# Patient Record
Sex: Female | Born: 1945 | Race: Black or African American | Hispanic: No | Smoking: Never smoker
Health system: Southern US, Community
[De-identification: ages and names within clinical notes are randomized; demographics above are authoritative.]

## PROBLEM LIST (undated history)

## (undated) DIAGNOSIS — E1149 Type 2 diabetes mellitus with other diabetic neurological complication: Secondary | ICD-10-CM

## (undated) DIAGNOSIS — E785 Hyperlipidemia, unspecified: Secondary | ICD-10-CM

## (undated) DIAGNOSIS — E1140 Type 2 diabetes mellitus with diabetic neuropathy, unspecified: Secondary | ICD-10-CM

## (undated) DIAGNOSIS — G5602 Carpal tunnel syndrome, left upper limb: Secondary | ICD-10-CM

## (undated) DIAGNOSIS — I10 Essential (primary) hypertension: Secondary | ICD-10-CM

## (undated) DIAGNOSIS — E669 Obesity, unspecified: Secondary | ICD-10-CM

## (undated) DIAGNOSIS — I700 Atherosclerosis of aorta: Secondary | ICD-10-CM

## (undated) DIAGNOSIS — R202 Paresthesia of skin: Secondary | ICD-10-CM

## (undated) DIAGNOSIS — H409 Unspecified glaucoma: Secondary | ICD-10-CM

## (undated) HISTORY — DX: Atherosclerosis of aorta: I70.0

## (undated) HISTORY — DX: Type 2 diabetes mellitus with diabetic neuropathy, unspecified: E11.40

## (undated) HISTORY — DX: Hyperlipidemia, unspecified: E78.5

## (undated) HISTORY — DX: Type 2 diabetes mellitus with other diabetic neurological complication: E11.49

## (undated) HISTORY — DX: Essential (primary) hypertension: I10

## (undated) HISTORY — DX: Obesity, unspecified: E66.9

## (undated) HISTORY — DX: Carpal tunnel syndrome, left upper limb: G56.02

## (undated) HISTORY — DX: Unspecified glaucoma: H40.9

## (undated) HISTORY — DX: Paresthesia of skin: R20.2

---

## 1998-05-02 HISTORY — PX: BREAST EXCISIONAL BIOPSY: SUR124

## 1998-07-15 ENCOUNTER — Ambulatory Visit (HOSPITAL_COMMUNITY): Admission: RE | Admit: 1998-07-15 | Discharge: 1998-07-15 | Payer: Self-pay | Admitting: Obstetrics and Gynecology

## 1998-07-15 ENCOUNTER — Encounter: Payer: Self-pay | Admitting: Obstetrics and Gynecology

## 1999-02-18 ENCOUNTER — Ambulatory Visit (HOSPITAL_COMMUNITY): Admission: RE | Admit: 1999-02-18 | Discharge: 1999-02-18 | Payer: Self-pay | Admitting: Obstetrics and Gynecology

## 1999-02-18 ENCOUNTER — Encounter: Payer: Self-pay | Admitting: Obstetrics and Gynecology

## 1999-03-18 ENCOUNTER — Encounter (HOSPITAL_BASED_OUTPATIENT_CLINIC_OR_DEPARTMENT_OTHER): Payer: Self-pay | Admitting: General Surgery

## 1999-03-18 ENCOUNTER — Encounter (INDEPENDENT_AMBULATORY_CARE_PROVIDER_SITE_OTHER): Payer: Self-pay | Admitting: *Deleted

## 1999-03-18 ENCOUNTER — Ambulatory Visit (HOSPITAL_COMMUNITY): Admission: RE | Admit: 1999-03-18 | Discharge: 1999-03-18 | Payer: Self-pay | Admitting: General Surgery

## 1999-06-21 ENCOUNTER — Encounter: Admission: RE | Admit: 1999-06-21 | Discharge: 1999-06-21 | Payer: Self-pay | Admitting: Family Medicine

## 1999-06-21 ENCOUNTER — Encounter: Payer: Self-pay | Admitting: Family Medicine

## 1999-09-24 ENCOUNTER — Encounter: Admission: RE | Admit: 1999-09-24 | Discharge: 1999-09-24 | Payer: Self-pay | Admitting: General Surgery

## 1999-09-24 ENCOUNTER — Encounter (HOSPITAL_BASED_OUTPATIENT_CLINIC_OR_DEPARTMENT_OTHER): Payer: Self-pay | Admitting: General Surgery

## 2000-09-25 ENCOUNTER — Encounter: Payer: Self-pay | Admitting: Obstetrics and Gynecology

## 2000-09-25 ENCOUNTER — Ambulatory Visit (HOSPITAL_COMMUNITY): Admission: RE | Admit: 2000-09-25 | Discharge: 2000-09-25 | Payer: Self-pay | Admitting: Obstetrics and Gynecology

## 2001-11-26 ENCOUNTER — Ambulatory Visit (HOSPITAL_COMMUNITY): Admission: RE | Admit: 2001-11-26 | Discharge: 2001-11-26 | Payer: Self-pay | Admitting: Gastroenterology

## 2001-11-26 ENCOUNTER — Encounter (INDEPENDENT_AMBULATORY_CARE_PROVIDER_SITE_OTHER): Payer: Self-pay

## 2002-01-04 ENCOUNTER — Emergency Department (HOSPITAL_COMMUNITY): Admission: EM | Admit: 2002-01-04 | Discharge: 2002-01-04 | Payer: Self-pay | Admitting: Emergency Medicine

## 2002-01-04 ENCOUNTER — Encounter: Payer: Self-pay | Admitting: Emergency Medicine

## 2002-01-23 ENCOUNTER — Encounter: Payer: Self-pay | Admitting: Obstetrics and Gynecology

## 2002-01-23 ENCOUNTER — Ambulatory Visit (HOSPITAL_COMMUNITY): Admission: RE | Admit: 2002-01-23 | Discharge: 2002-01-23 | Payer: Self-pay | Admitting: Obstetrics and Gynecology

## 2003-01-15 ENCOUNTER — Encounter: Payer: Self-pay | Admitting: Obstetrics and Gynecology

## 2003-01-15 ENCOUNTER — Encounter: Admission: RE | Admit: 2003-01-15 | Discharge: 2003-01-15 | Payer: Self-pay | Admitting: Obstetrics and Gynecology

## 2004-03-09 ENCOUNTER — Encounter: Admission: RE | Admit: 2004-03-09 | Discharge: 2004-03-09 | Payer: Self-pay | Admitting: Obstetrics and Gynecology

## 2005-03-15 ENCOUNTER — Encounter: Admission: RE | Admit: 2005-03-15 | Discharge: 2005-03-15 | Payer: Self-pay | Admitting: Obstetrics and Gynecology

## 2005-03-23 ENCOUNTER — Other Ambulatory Visit: Admission: RE | Admit: 2005-03-23 | Discharge: 2005-03-23 | Payer: Self-pay | Admitting: Obstetrics and Gynecology

## 2006-04-20 ENCOUNTER — Encounter: Admission: RE | Admit: 2006-04-20 | Discharge: 2006-04-20 | Payer: Self-pay | Admitting: Obstetrics and Gynecology

## 2007-07-11 ENCOUNTER — Encounter: Admission: RE | Admit: 2007-07-11 | Discharge: 2007-07-11 | Payer: Self-pay | Admitting: Obstetrics and Gynecology

## 2008-07-11 ENCOUNTER — Encounter: Admission: RE | Admit: 2008-07-11 | Discharge: 2008-07-11 | Payer: Self-pay | Admitting: Obstetrics and Gynecology

## 2010-06-25 ENCOUNTER — Other Ambulatory Visit: Payer: Self-pay | Admitting: Obstetrics and Gynecology

## 2010-06-25 DIAGNOSIS — Z1231 Encounter for screening mammogram for malignant neoplasm of breast: Secondary | ICD-10-CM

## 2010-07-06 ENCOUNTER — Other Ambulatory Visit: Payer: Self-pay | Admitting: Internal Medicine

## 2010-07-06 ENCOUNTER — Ambulatory Visit
Admission: RE | Admit: 2010-07-06 | Discharge: 2010-07-06 | Disposition: A | Discharge status: Home / Self Care | Payer: Medicare Other | Source: Ambulatory Visit | Attending: Obstetrics and Gynecology | Admitting: Obstetrics and Gynecology

## 2010-07-06 DIAGNOSIS — Z1231 Encounter for screening mammogram for malignant neoplasm of breast: Secondary | ICD-10-CM

## 2010-09-17 NOTE — Op Note (Signed)
   TNAMEKAMBRE, MESSNER                           ACCOUNT NO.:  [id number]   MEDICAL RECORD NO.:  [id number]                   PATIENT TYPE:  AMB   LOCATION:  ENDO                                 FACILITY:  [facility]   PHYSICIAN:  Barrie Folk, M.D.                  DATE OF BIRTH:   DATE OF PROCEDURE:  11/26/2001  DATE OF DISCHARGE:  11/26/2001                                 OPERATIVE REPORT   PROCEDURE:  Colonoscopy with polypectomy.   INDICATIONS FOR PROCEDURE:  Screening  colonoscopy in a 65 year old patient.   DESCRIPTION OF PROCEDURE:  The Olympus video colonoscope was inserted into  the rectum and advanced to the cecum, confirmed by transillumination of  McBurney's point and visualization of the ileocecal valve and appendiceal  orifice.  The prep was excellent.  In the cecum, there was an 8 mm polyp  removed by hot biopsy .  The remainder of the cecum, ascending, transverse,  descending colon all appeared normal with no masses, polyps, diverticulitis,  or mucosal abnormalities.  In the proximal sigmoid colon, there was seen a  broad-based 2 cm polyp which was removed by snare.  It  was not absolutely  certain there all elements of the polyp had been removed but the base did  appear to be completely cauterized.  The remainder of the sigmoid and rectum  appeared normal.  The colonoscope was then withdrawn and the patient  returned to the recovery room in stable condition.  She tolerated the  procedure well and there were no immediate complications.   IMPRESSION:  One cecal and one large sigmoid polyp.   PLAN:  Await histology and will probably repeat colonoscopy in one year  unless surgery required for malignancy in the sigmoid polyp.                                                Barrie Folk, M.D.    [facility]/MEDQ  D:  11/26/2001  T:  11/29/2001  Job:  [id number]   cc:   Alanson Aly. Lenise Arena, M.D.

## 2011-06-06 ENCOUNTER — Other Ambulatory Visit: Payer: Self-pay | Admitting: Obstetrics and Gynecology

## 2011-06-06 ENCOUNTER — Other Ambulatory Visit: Payer: Self-pay | Admitting: Internal Medicine

## 2011-06-06 DIAGNOSIS — Z1231 Encounter for screening mammogram for malignant neoplasm of breast: Secondary | ICD-10-CM

## 2011-07-11 ENCOUNTER — Ambulatory Visit
Admission: RE | Admit: 2011-07-11 | Discharge: 2011-07-11 | Disposition: A | Discharge status: Home / Self Care | Payer: Medicare Other | Source: Ambulatory Visit | Attending: Obstetrics and Gynecology | Admitting: Obstetrics and Gynecology

## 2011-07-11 DIAGNOSIS — Z1231 Encounter for screening mammogram for malignant neoplasm of breast: Secondary | ICD-10-CM

## 2011-07-27 DIAGNOSIS — K219 Gastro-esophageal reflux disease without esophagitis: Secondary | ICD-10-CM | POA: Diagnosis not present

## 2011-07-27 DIAGNOSIS — R059 Cough, unspecified: Secondary | ICD-10-CM | POA: Diagnosis not present

## 2011-07-27 DIAGNOSIS — R05 Cough: Secondary | ICD-10-CM | POA: Diagnosis not present

## 2011-10-19 DIAGNOSIS — I10 Essential (primary) hypertension: Secondary | ICD-10-CM | POA: Diagnosis not present

## 2011-10-19 DIAGNOSIS — E782 Mixed hyperlipidemia: Secondary | ICD-10-CM | POA: Diagnosis not present

## 2011-10-19 DIAGNOSIS — E1149 Type 2 diabetes mellitus with other diabetic neurological complication: Secondary | ICD-10-CM | POA: Diagnosis not present

## 2011-10-19 DIAGNOSIS — G609 Hereditary and idiopathic neuropathy, unspecified: Secondary | ICD-10-CM | POA: Diagnosis not present

## 2011-11-15 DIAGNOSIS — E119 Type 2 diabetes mellitus without complications: Secondary | ICD-10-CM | POA: Diagnosis not present

## 2011-11-15 DIAGNOSIS — E782 Mixed hyperlipidemia: Secondary | ICD-10-CM | POA: Diagnosis not present

## 2012-02-23 DIAGNOSIS — H4011X0 Primary open-angle glaucoma, stage unspecified: Secondary | ICD-10-CM | POA: Diagnosis not present

## 2012-03-26 DIAGNOSIS — H4011X0 Primary open-angle glaucoma, stage unspecified: Secondary | ICD-10-CM | POA: Diagnosis not present

## 2012-04-19 DIAGNOSIS — E1149 Type 2 diabetes mellitus with other diabetic neurological complication: Secondary | ICD-10-CM | POA: Diagnosis not present

## 2012-04-19 DIAGNOSIS — Z23 Encounter for immunization: Secondary | ICD-10-CM | POA: Diagnosis not present

## 2012-04-19 DIAGNOSIS — G609 Hereditary and idiopathic neuropathy, unspecified: Secondary | ICD-10-CM | POA: Diagnosis not present

## 2012-04-19 DIAGNOSIS — E782 Mixed hyperlipidemia: Secondary | ICD-10-CM | POA: Diagnosis not present

## 2012-04-19 DIAGNOSIS — I10 Essential (primary) hypertension: Secondary | ICD-10-CM | POA: Diagnosis not present

## 2012-06-12 DIAGNOSIS — H4011X0 Primary open-angle glaucoma, stage unspecified: Secondary | ICD-10-CM | POA: Diagnosis not present

## 2012-07-16 DIAGNOSIS — E1149 Type 2 diabetes mellitus with other diabetic neurological complication: Secondary | ICD-10-CM | POA: Diagnosis not present

## 2012-07-16 DIAGNOSIS — I10 Essential (primary) hypertension: Secondary | ICD-10-CM | POA: Diagnosis not present

## 2012-07-16 DIAGNOSIS — E782 Mixed hyperlipidemia: Secondary | ICD-10-CM | POA: Diagnosis not present

## 2012-07-18 DIAGNOSIS — E1149 Type 2 diabetes mellitus with other diabetic neurological complication: Secondary | ICD-10-CM | POA: Diagnosis not present

## 2012-07-18 DIAGNOSIS — E782 Mixed hyperlipidemia: Secondary | ICD-10-CM | POA: Diagnosis not present

## 2012-07-18 DIAGNOSIS — I10 Essential (primary) hypertension: Secondary | ICD-10-CM | POA: Diagnosis not present

## 2012-08-03 ENCOUNTER — Other Ambulatory Visit: Payer: Self-pay

## 2012-08-03 DIAGNOSIS — Z1231 Encounter for screening mammogram for malignant neoplasm of breast: Secondary | ICD-10-CM

## 2012-08-30 ENCOUNTER — Ambulatory Visit: Payer: Medicare Other

## 2012-08-31 ENCOUNTER — Ambulatory Visit
Admission: RE | Admit: 2012-08-31 | Discharge: 2012-08-31 | Disposition: A | Discharge status: Home / Self Care | Payer: Medicare Other | Source: Ambulatory Visit

## 2012-08-31 DIAGNOSIS — Z1231 Encounter for screening mammogram for malignant neoplasm of breast: Secondary | ICD-10-CM | POA: Diagnosis not present

## 2012-10-01 DIAGNOSIS — H4011X0 Primary open-angle glaucoma, stage unspecified: Secondary | ICD-10-CM | POA: Diagnosis not present

## 2012-10-16 DIAGNOSIS — E1149 Type 2 diabetes mellitus with other diabetic neurological complication: Secondary | ICD-10-CM | POA: Diagnosis not present

## 2012-10-16 DIAGNOSIS — E782 Mixed hyperlipidemia: Secondary | ICD-10-CM | POA: Diagnosis not present

## 2012-10-16 DIAGNOSIS — G609 Hereditary and idiopathic neuropathy, unspecified: Secondary | ICD-10-CM | POA: Diagnosis not present

## 2012-10-16 DIAGNOSIS — I10 Essential (primary) hypertension: Secondary | ICD-10-CM | POA: Diagnosis not present

## 2012-11-20 DIAGNOSIS — Z5181 Encounter for therapeutic drug level monitoring: Secondary | ICD-10-CM | POA: Diagnosis not present

## 2012-12-12 DIAGNOSIS — Z79899 Other long term (current) drug therapy: Secondary | ICD-10-CM | POA: Diagnosis not present

## 2012-12-12 DIAGNOSIS — I10 Essential (primary) hypertension: Secondary | ICD-10-CM | POA: Diagnosis not present

## 2012-12-12 DIAGNOSIS — E1149 Type 2 diabetes mellitus with other diabetic neurological complication: Secondary | ICD-10-CM | POA: Diagnosis not present

## 2012-12-12 DIAGNOSIS — E559 Vitamin D deficiency, unspecified: Secondary | ICD-10-CM | POA: Diagnosis not present

## 2012-12-12 DIAGNOSIS — E785 Hyperlipidemia, unspecified: Secondary | ICD-10-CM | POA: Diagnosis not present

## 2012-12-12 DIAGNOSIS — E8940 Asymptomatic postprocedural ovarian failure: Secondary | ICD-10-CM | POA: Diagnosis not present

## 2012-12-13 ENCOUNTER — Other Ambulatory Visit: Payer: Self-pay | Admitting: Family Medicine

## 2012-12-13 DIAGNOSIS — E8940 Asymptomatic postprocedural ovarian failure: Secondary | ICD-10-CM

## 2012-12-13 DIAGNOSIS — E2839 Other primary ovarian failure: Secondary | ICD-10-CM

## 2013-01-03 ENCOUNTER — Other Ambulatory Visit: Payer: Medicare Other

## 2013-01-10 DIAGNOSIS — E119 Type 2 diabetes mellitus without complications: Secondary | ICD-10-CM | POA: Diagnosis not present

## 2013-01-10 DIAGNOSIS — H4011X0 Primary open-angle glaucoma, stage unspecified: Secondary | ICD-10-CM | POA: Diagnosis not present

## 2013-01-14 ENCOUNTER — Other Ambulatory Visit: Payer: Medicare Other

## 2013-01-23 ENCOUNTER — Ambulatory Visit
Admission: RE | Admit: 2013-01-23 | Discharge: 2013-01-23 | Disposition: A | Discharge status: Home / Self Care | Payer: Medicare Other | Source: Ambulatory Visit | Attending: Family Medicine | Admitting: Family Medicine

## 2013-01-23 DIAGNOSIS — Z780 Asymptomatic menopausal state: Secondary | ICD-10-CM | POA: Diagnosis not present

## 2013-01-23 DIAGNOSIS — E2839 Other primary ovarian failure: Secondary | ICD-10-CM

## 2013-01-23 DIAGNOSIS — E8940 Asymptomatic postprocedural ovarian failure: Secondary | ICD-10-CM

## 2013-07-11 DIAGNOSIS — E559 Vitamin D deficiency, unspecified: Secondary | ICD-10-CM | POA: Diagnosis not present

## 2013-07-11 DIAGNOSIS — E785 Hyperlipidemia, unspecified: Secondary | ICD-10-CM | POA: Diagnosis not present

## 2013-07-11 DIAGNOSIS — E1149 Type 2 diabetes mellitus with other diabetic neurological complication: Secondary | ICD-10-CM | POA: Diagnosis not present

## 2013-07-11 DIAGNOSIS — I10 Essential (primary) hypertension: Secondary | ICD-10-CM | POA: Diagnosis not present

## 2013-07-11 DIAGNOSIS — Z79899 Other long term (current) drug therapy: Secondary | ICD-10-CM | POA: Diagnosis not present

## 2013-07-17 DIAGNOSIS — H4011X0 Primary open-angle glaucoma, stage unspecified: Secondary | ICD-10-CM | POA: Diagnosis not present

## 2013-07-17 DIAGNOSIS — H2510 Age-related nuclear cataract, unspecified eye: Secondary | ICD-10-CM | POA: Diagnosis not present

## 2013-07-17 DIAGNOSIS — E119 Type 2 diabetes mellitus without complications: Secondary | ICD-10-CM | POA: Diagnosis not present

## 2013-10-17 DIAGNOSIS — E559 Vitamin D deficiency, unspecified: Secondary | ICD-10-CM | POA: Diagnosis not present

## 2013-12-26 DIAGNOSIS — E1149 Type 2 diabetes mellitus with other diabetic neurological complication: Secondary | ICD-10-CM | POA: Diagnosis not present

## 2013-12-26 DIAGNOSIS — Z79899 Other long term (current) drug therapy: Secondary | ICD-10-CM | POA: Diagnosis not present

## 2013-12-26 DIAGNOSIS — E049 Nontoxic goiter, unspecified: Secondary | ICD-10-CM | POA: Diagnosis not present

## 2013-12-26 DIAGNOSIS — E1142 Type 2 diabetes mellitus with diabetic polyneuropathy: Secondary | ICD-10-CM | POA: Diagnosis not present

## 2013-12-26 DIAGNOSIS — Z1239 Encounter for other screening for malignant neoplasm of breast: Secondary | ICD-10-CM | POA: Diagnosis not present

## 2013-12-26 DIAGNOSIS — E559 Vitamin D deficiency, unspecified: Secondary | ICD-10-CM | POA: Diagnosis not present

## 2013-12-26 DIAGNOSIS — E785 Hyperlipidemia, unspecified: Secondary | ICD-10-CM | POA: Diagnosis not present

## 2013-12-26 DIAGNOSIS — I10 Essential (primary) hypertension: Secondary | ICD-10-CM | POA: Diagnosis not present

## 2013-12-27 ENCOUNTER — Other Ambulatory Visit: Payer: Self-pay | Admitting: Family Medicine

## 2013-12-27 DIAGNOSIS — E049 Nontoxic goiter, unspecified: Secondary | ICD-10-CM

## 2014-01-02 ENCOUNTER — Encounter (INDEPENDENT_AMBULATORY_CARE_PROVIDER_SITE_OTHER): Payer: Self-pay

## 2014-01-02 ENCOUNTER — Ambulatory Visit
Admission: RE | Admit: 2014-01-02 | Discharge: 2014-01-02 | Disposition: A | Discharge status: Home / Self Care | Payer: Medicare Other | Source: Ambulatory Visit | Attending: Family Medicine | Admitting: Family Medicine

## 2014-01-02 DIAGNOSIS — E049 Nontoxic goiter, unspecified: Secondary | ICD-10-CM

## 2014-01-02 DIAGNOSIS — E042 Nontoxic multinodular goiter: Secondary | ICD-10-CM | POA: Diagnosis not present

## 2014-01-13 DIAGNOSIS — E041 Nontoxic single thyroid nodule: Secondary | ICD-10-CM | POA: Diagnosis not present

## 2014-01-14 ENCOUNTER — Other Ambulatory Visit: Payer: Self-pay | Admitting: Family Medicine

## 2014-01-14 DIAGNOSIS — Z1231 Encounter for screening mammogram for malignant neoplasm of breast: Secondary | ICD-10-CM

## 2014-01-16 ENCOUNTER — Other Ambulatory Visit: Payer: Self-pay | Admitting: Otolaryngology

## 2014-01-16 DIAGNOSIS — E041 Nontoxic single thyroid nodule: Secondary | ICD-10-CM

## 2014-01-22 ENCOUNTER — Other Ambulatory Visit (HOSPITAL_COMMUNITY)
Admission: RE | Admit: 2014-01-22 | Discharge: 2014-01-22 | Disposition: A | Discharge status: Home / Self Care | Payer: Medicare Other | Source: Ambulatory Visit | Attending: Interventional Radiology | Admitting: Interventional Radiology

## 2014-01-22 ENCOUNTER — Ambulatory Visit
Admission: RE | Admit: 2014-01-22 | Discharge: 2014-01-22 | Disposition: A | Discharge status: Home / Self Care | Payer: Medicare Other | Source: Ambulatory Visit | Attending: Otolaryngology | Admitting: Otolaryngology

## 2014-01-22 DIAGNOSIS — E041 Nontoxic single thyroid nodule: Secondary | ICD-10-CM

## 2014-01-29 ENCOUNTER — Ambulatory Visit
Admission: RE | Admit: 2014-01-29 | Discharge: 2014-01-29 | Disposition: A | Discharge status: Home / Self Care | Payer: Medicare Other | Source: Ambulatory Visit | Attending: Family Medicine | Admitting: Family Medicine

## 2014-01-29 DIAGNOSIS — Z1231 Encounter for screening mammogram for malignant neoplasm of breast: Secondary | ICD-10-CM

## 2014-01-31 DIAGNOSIS — E041 Nontoxic single thyroid nodule: Secondary | ICD-10-CM | POA: Diagnosis not present

## 2014-04-29 DIAGNOSIS — E1140 Type 2 diabetes mellitus with diabetic neuropathy, unspecified: Secondary | ICD-10-CM | POA: Diagnosis not present

## 2014-04-29 DIAGNOSIS — E785 Hyperlipidemia, unspecified: Secondary | ICD-10-CM | POA: Diagnosis not present

## 2014-04-29 DIAGNOSIS — Z1211 Encounter for screening for malignant neoplasm of colon: Secondary | ICD-10-CM | POA: Diagnosis not present

## 2014-04-29 DIAGNOSIS — E042 Nontoxic multinodular goiter: Secondary | ICD-10-CM | POA: Diagnosis not present

## 2014-04-29 DIAGNOSIS — E559 Vitamin D deficiency, unspecified: Secondary | ICD-10-CM | POA: Diagnosis not present

## 2014-04-29 DIAGNOSIS — I10 Essential (primary) hypertension: Secondary | ICD-10-CM | POA: Diagnosis not present

## 2014-05-01 DIAGNOSIS — I10 Essential (primary) hypertension: Secondary | ICD-10-CM | POA: Insufficient documentation

## 2014-05-01 DIAGNOSIS — E1140 Type 2 diabetes mellitus with diabetic neuropathy, unspecified: Secondary | ICD-10-CM | POA: Insufficient documentation

## 2014-05-01 DIAGNOSIS — E785 Hyperlipidemia, unspecified: Secondary | ICD-10-CM | POA: Insufficient documentation

## 2014-05-01 DIAGNOSIS — E1149 Type 2 diabetes mellitus with other diabetic neurological complication: Secondary | ICD-10-CM | POA: Insufficient documentation

## 2014-05-01 DIAGNOSIS — E559 Vitamin D deficiency, unspecified: Secondary | ICD-10-CM

## 2014-08-21 ENCOUNTER — Encounter: Payer: Self-pay | Admitting: Family Medicine

## 2014-12-07 ENCOUNTER — Emergency Department (HOSPITAL_COMMUNITY)
Admission: EM | Admit: 2014-12-07 | Discharge: 2014-12-07 | Disposition: A | Discharge status: Home / Self Care | Payer: Medicare Other | Attending: Emergency Medicine | Admitting: Emergency Medicine

## 2014-12-07 ENCOUNTER — Encounter (HOSPITAL_COMMUNITY): Payer: Self-pay | Admitting: Nurse Practitioner

## 2014-12-07 DIAGNOSIS — M79602 Pain in left arm: Secondary | ICD-10-CM | POA: Insufficient documentation

## 2014-12-07 DIAGNOSIS — E1140 Type 2 diabetes mellitus with diabetic neuropathy, unspecified: Secondary | ICD-10-CM | POA: Insufficient documentation

## 2014-12-07 DIAGNOSIS — I10 Essential (primary) hypertension: Secondary | ICD-10-CM | POA: Insufficient documentation

## 2014-12-07 DIAGNOSIS — M542 Cervicalgia: Secondary | ICD-10-CM | POA: Insufficient documentation

## 2014-12-07 DIAGNOSIS — Z79899 Other long term (current) drug therapy: Secondary | ICD-10-CM | POA: Insufficient documentation

## 2014-12-07 DIAGNOSIS — E669 Obesity, unspecified: Secondary | ICD-10-CM | POA: Insufficient documentation

## 2014-12-07 DIAGNOSIS — Z8669 Personal history of other diseases of the nervous system and sense organs: Secondary | ICD-10-CM | POA: Diagnosis not present

## 2014-12-07 DIAGNOSIS — Z7982 Long term (current) use of aspirin: Secondary | ICD-10-CM | POA: Insufficient documentation

## 2014-12-07 DIAGNOSIS — M791 Myalgia, unspecified site: Secondary | ICD-10-CM

## 2014-12-07 MED ORDER — HYDROCODONE-ACETAMINOPHEN 5-325 MG PO TABS: 2.0000 | ORAL_TABLET | ORAL | Status: DC | PRN

## 2014-12-07 MED ORDER — IBUPROFEN 800 MG PO TABS
800.0000 mg | ORAL_TABLET | Freq: Once | ORAL | Status: AC
  Administered 2014-12-07: 800 mg via ORAL
  Filled 2014-12-07: qty 1

## 2014-12-07 MED ORDER — METHOCARBAMOL 500 MG PO TABS
1000.0000 mg | ORAL_TABLET | Freq: Once | ORAL | Status: AC
Start: 2014-12-07 — End: 2014-12-07
  Administered 2014-12-07: 1000 mg via ORAL
  Filled 2014-12-07: qty 2

## 2014-12-07 MED ORDER — METHOCARBAMOL 500 MG PO TABS: 500.0000 mg | ORAL_TABLET | Freq: Three times a day (TID) | ORAL | Status: DC | PRN

## 2014-12-07 MED ORDER — NAPROXEN 500 MG PO TABS: 500.0000 mg | ORAL_TABLET | Freq: Two times a day (BID) | ORAL | Status: DC

## 2014-12-07 NOTE — Discharge Instructions (Signed)
Follow-up with her primary care physician within the next 5 or 6 days if not improving. Return to the ER with any chest pain shortness of breath weakness or other new or changing or worsening complaints   Muscle Pain Muscle pain (myalgia) may be caused by many things, including:  Overuse or muscle strain, especially if you are not in shape. This is the most common cause of muscle pain.  Injury.  Bruises.  Viruses, such as the flu.  Infectious diseases.  Fibromyalgia, which is a chronic condition that causes muscle tenderness, fatigue, and headache.  Autoimmune diseases, including lupus.  Certain drugs, including ACE inhibitors and statins. Muscle pain may be mild or severe. In most cases, the pain lasts only a short time and goes away without treatment. To diagnose the cause of your muscle pain, your health care provider will take your medical history. This means he or she will ask you when your muscle pain began and what has been happening. If you have not had muscle pain for very long, your health care provider may want to wait before doing much testing. If your muscle pain has lasted a long time, your health care provider may want to run tests right away. If your health care provider thinks your muscle pain may be caused by illness, you may need to have additional tests to rule out certain conditions.  Treatment for muscle pain depends on the cause. Home care is often enough to relieve muscle pain. Your health care provider may also prescribe anti-inflammatory medicine. HOME CARE INSTRUCTIONS Watch your condition for any changes. The following actions may help to lessen any discomfort you are feeling:  Only take over-the-counter or prescription medicines as directed by your health care provider.  Apply ice to the sore muscle:  Put ice in a plastic bag.  Place a towel between your skin and the bag.  Leave the ice on for 15-20 minutes, 3-4 times a day.  You may alternate  applying hot and cold packs to the muscle as directed by your health care provider.  If overuse is causing your muscle pain, slow down your activities until the pain goes away.  Remember that it is normal to feel some muscle pain after starting a workout program. Muscles that have not been used often will be sore at first.  Do regular, gentle exercises if you are not usually active.  Warm up before exercising to lower your risk of muscle pain.  Do not continue working out if the pain is very bad. Bad pain could mean you have injured a muscle. SEEK MEDICAL CARE IF:  Your muscle pain gets worse, and medicines do not help.  You have muscle pain that lasts longer than 3 days.  You have a rash or fever along with muscle pain.  You have muscle pain after a tick bite.  You have muscle pain while working out, even though you are in good physical condition.  You have redness, soreness, or swelling along with muscle pain.  You have muscle pain after starting a new medicine or changing the dose of a medicine. SEEK IMMEDIATE MEDICAL CARE IF:  You have trouble breathing.  You have trouble swallowing.  You have muscle pain along with a stiff neck, fever, and vomiting.  You have severe muscle weakness or cannot move part of your body. MAKE SURE YOU:   Understand these instructions.  Will watch your condition.  Will get help right away if you are not doing well or  get worse. Document Released: 03/10/2006 Document Revised: 04/23/2013 Document Reviewed: 02/12/2013 [facility] Patient Information 2015 [place], [place]. This information is not intended to replace advice given to you by your health care provider. Make sure you discuss any questions you have with your health care provider.

## 2014-12-07 NOTE — ED Notes (Signed)
Pt presents with c/o left arm pain 5/10, states has being ongoing for the last 3 days ago became severe tonight. Denies any other symptoms

## 2014-12-07 NOTE — ED Provider Notes (Signed)
CSN: [id number]     Arrival date & time 12/07/14  2007 History   First MD Initiated Contact with Patient 12/07/14 2027     Chief Complaint  Patient presents with  . Arm Pain    Numbness      HPI  Patient presents for evaluation of arm pain. Pain is primary okay to the left posterior shoulder. Hurts to move the arm. She pain in her neck 2 weeks ago but does not recall hurting immediately after that. This is been a 3 or four-day illness. No chest pain or shortness of breath. No fevers or chills. No weakness or disuse of the arm.  Past Medical History  Diagnosis Date  . Hypertension   . Hyperlipidemia   . Obesity   . Paresthesias     L and R hand   . Diabetic neuropathy   . Glaucoma   . Carpal tunnel syndrome, left     mild  . Diabetes mellitus with neurological manifestations, controlled     diabetic with peripheral neuropathy   History reviewed. No pertinent past surgical history. History reviewed. No pertinent family history. History  Substance Use Topics  . Smoking status: Never Smoker   . Smokeless tobacco: Not on file  . Alcohol Use: 0.6 oz/week    1 Glasses of wine per week     Comment: socially   OB History    No data available     Review of Systems  Constitutional: Negative for fever, chills, diaphoresis, appetite change and fatigue.  HENT: Negative for mouth sores, sore throat and trouble swallowing.   Eyes: Negative for visual disturbance.  Respiratory: Negative for cough, chest tightness, shortness of breath and wheezing.   Cardiovascular: Negative for chest pain.  Gastrointestinal: Negative for nausea, vomiting, abdominal pain, diarrhea and abdominal distention.  Endocrine: Negative for polydipsia, polyphagia and polyuria.  Genitourinary: Negative for dysuria, frequency and hematuria.  Musculoskeletal: Positive for myalgias, arthralgias and neck pain. Negative for gait problem and neck stiffness.  Skin: Negative for color change, pallor and rash.    Neurological: Negative for dizziness, syncope, light-headedness and headaches.  Hematological: Does not bruise/bleed easily.  Psychiatric/Behavioral: Negative for behavioral problems and confusion.      Allergies  Review of patient's allergies indicates no known allergies.  Home Medications   Prior to Admission medications   Medication Sig Start Date End Date Taking? Authorizing Provider  Calcium Carb-Cholecalciferol (CALCIUM-VITAMIN D) 600-400 MG-UNIT TABS Take 1 tablet by mouth daily.   Yes Historical Provider, MD  lisinopril (PRINIVIL,ZESTRIL) 20 MG tablet Take 20 mg by mouth daily.   Yes Historical Provider, MD  metFORMIN (GLUCOPHAGE) 1000 MG tablet Take 1,000 mg by mouth 2 (two) times daily with a meal.   Yes Historical Provider, MD  Multiple Vitamin (MULTIVITAMIN) tablet Take 1 tablet by mouth daily.   Yes Historical Provider, MD  Omega-3 Fatty Acids (FISH OIL) 1200 MG CAPS Take 2 capsules by mouth 2 (two) times a week.   Yes Historical Provider, MD  aspirin 81 MG tablet Take 81 mg by mouth daily as needed for pain.     Historical Provider, MD  HYDROcodone-acetaminophen (NORCO/VICODIN) 5-325 MG per tablet Take 2 tablets by mouth every 4 (four) hours as needed. 12/07/14   Tanna Furry, MD  methocarbamol (ROBAXIN) 500 MG tablet Take 1 tablet (500 mg total) by mouth 3 (three) times daily between meals as needed. 12/07/14   Tanna Furry, MD  naproxen (NAPROSYN) 500 MG tablet Take 1 tablet (500  mg total) by mouth 2 (two) times daily. 12/07/14   Tanna Furry, MD   BP 173/74 mmHg  Pulse 80  Temp(Src) 97.5 F (36.4 C) (Oral)  Resp 16  Ht 5\' 6"  (1.676 m)  Wt 197 lb (89.359 kg)  BMI 31.81 kg/m2  SpO2 99% Physical Exam  Constitutional: She is oriented to person, place, and time. She appears well-developed and well-nourished. No distress.  HENT:  Head: Normocephalic.  Eyes: Conjunctivae are normal. Pupils are equal, round, and reactive to light. No scleral icterus.  Neck: Normal range of  motion. Neck supple. No thyromegaly present.  Cardiovascular: Normal rate and regular rhythm.  Exam reveals no gallop and no friction rub.   No murmur heard. Pulmonary/Chest: Effort normal and breath sounds normal. No respiratory distress. She has no wheezes. She has no rales.  Abdominal: Soft. Bowel sounds are normal. She exhibits no distension. There is no tenderness. There is no rebound.  Musculoskeletal: Normal range of motion.       Back:  Negative Spurling's signs. No Lhermitte's changes.  Neurological: She is alert and oriented to person, place, and time.  Normal symmetric Strength to shoulder shrug, triceps, biceps, grip,wrist flex/extend,and intrinsics  Norma lsymmetric sensation above and below clavicles, and to all distributions to UEs. Norma symmetric strength to flex/.extend hip and knees, dorsi/plantar flex ankles. Normal symmetric sensation to all distributions to LEs Patellar and achilles reflexes 1-2+. Downgoing Babinski   Skin: Skin is warm and dry. No rash noted.  Psychiatric: She has a normal mood and affect. Her behavior is normal.    ED Course  Procedures (including critical care time) Labs Review Labs Reviewed - No data to display  Imaging Review No results found.   EKG Interpretation None      MDM   Final diagnoses:  Muscular pain    Pain is reproducible scapular abduction. Does not seem to start immediately paraspinal. Does not follow a particular radicular pattern. No neurological symptoms or findings. No chest pain or shortness of breath. Plan is treatment for musculoskeletal pain. Careful attention to symptoms educated regarding return symptoms including chest pain shortness of breath fever weakness or other new or worsening symptoms.    Tanna Furry, MD 12/07/14 2114

## 2014-12-22 DIAGNOSIS — E782 Mixed hyperlipidemia: Secondary | ICD-10-CM | POA: Diagnosis not present

## 2014-12-22 DIAGNOSIS — Z79899 Other long term (current) drug therapy: Secondary | ICD-10-CM | POA: Diagnosis not present

## 2014-12-22 DIAGNOSIS — E041 Nontoxic single thyroid nodule: Secondary | ICD-10-CM | POA: Diagnosis not present

## 2014-12-22 DIAGNOSIS — E119 Type 2 diabetes mellitus without complications: Secondary | ICD-10-CM | POA: Diagnosis not present

## 2014-12-22 DIAGNOSIS — I10 Essential (primary) hypertension: Secondary | ICD-10-CM | POA: Diagnosis not present

## 2014-12-22 DIAGNOSIS — Z23 Encounter for immunization: Secondary | ICD-10-CM | POA: Diagnosis not present

## 2015-02-06 DIAGNOSIS — Z86010 Personal history of colonic polyps: Secondary | ICD-10-CM | POA: Diagnosis not present

## 2015-02-06 DIAGNOSIS — Z09 Encounter for follow-up examination after completed treatment for conditions other than malignant neoplasm: Secondary | ICD-10-CM | POA: Diagnosis not present

## 2015-02-06 DIAGNOSIS — D126 Benign neoplasm of colon, unspecified: Secondary | ICD-10-CM | POA: Diagnosis not present

## 2015-02-06 DIAGNOSIS — D124 Benign neoplasm of descending colon: Secondary | ICD-10-CM | POA: Diagnosis not present

## 2015-03-07 ENCOUNTER — Encounter (HOSPITAL_COMMUNITY): Payer: Self-pay

## 2015-03-07 ENCOUNTER — Emergency Department (HOSPITAL_COMMUNITY)
Admission: EM | Admit: 2015-03-07 | Discharge: 2015-03-07 | Disposition: A | Discharge status: Home / Self Care | Payer: Medicare Other | Attending: Emergency Medicine | Admitting: Emergency Medicine

## 2015-03-07 DIAGNOSIS — R109 Unspecified abdominal pain: Secondary | ICD-10-CM | POA: Diagnosis not present

## 2015-03-07 DIAGNOSIS — M545 Low back pain, unspecified: Secondary | ICD-10-CM

## 2015-03-07 DIAGNOSIS — Z8669 Personal history of other diseases of the nervous system and sense organs: Secondary | ICD-10-CM | POA: Diagnosis not present

## 2015-03-07 DIAGNOSIS — E1140 Type 2 diabetes mellitus with diabetic neuropathy, unspecified: Secondary | ICD-10-CM | POA: Insufficient documentation

## 2015-03-07 DIAGNOSIS — I10 Essential (primary) hypertension: Secondary | ICD-10-CM | POA: Diagnosis not present

## 2015-03-07 DIAGNOSIS — E669 Obesity, unspecified: Secondary | ICD-10-CM | POA: Insufficient documentation

## 2015-03-07 DIAGNOSIS — R202 Paresthesia of skin: Secondary | ICD-10-CM | POA: Diagnosis not present

## 2015-03-07 DIAGNOSIS — Z791 Long term (current) use of non-steroidal anti-inflammatories (NSAID): Secondary | ICD-10-CM | POA: Diagnosis not present

## 2015-03-07 DIAGNOSIS — Z79899 Other long term (current) drug therapy: Secondary | ICD-10-CM | POA: Insufficient documentation

## 2015-03-07 LAB — URINALYSIS, ROUTINE W REFLEX MICROSCOPIC
BILIRUBIN URINE: NEGATIVE
Glucose, UA: NEGATIVE mg/dL
Hgb urine dipstick: NEGATIVE
Ketones, ur: NEGATIVE mg/dL
LEUKOCYTES UA: NEGATIVE
NITRITE: NEGATIVE
Protein, ur: NEGATIVE mg/dL
SPECIFIC GRAVITY, URINE: 1.004 — AB (ref 1.005–1.030)
UROBILINOGEN UA: 0.2 mg/dL (ref 0.0–1.0)
pH: 6.5 (ref 5.0–8.0)

## 2015-03-07 MED ORDER — TRAMADOL HCL 50 MG PO TABS
50.0000 mg | ORAL_TABLET | Freq: Once | ORAL | Status: AC
  Administered 2015-03-07: 50 mg via ORAL
  Filled 2015-03-07: qty 1

## 2015-03-07 MED ORDER — CYCLOBENZAPRINE HCL 10 MG PO TABS: 10.0000 mg | ORAL_TABLET | Freq: Two times a day (BID) | ORAL | Status: DC | PRN

## 2015-03-07 MED ORDER — TRAMADOL HCL 50 MG PO TABS: 50.0000 mg | ORAL_TABLET | Freq: Four times a day (QID) | ORAL | Status: AC | PRN

## 2015-03-07 MED ORDER — CYCLOBENZAPRINE HCL 10 MG PO TABS
10.0000 mg | ORAL_TABLET | Freq: Once | ORAL | Status: AC
  Administered 2015-03-07: 10 mg via ORAL
  Filled 2015-03-07: qty 1

## 2015-03-07 NOTE — ED Provider Notes (Signed)
CSN: [id number]     Arrival date & time 03/07/15  0341 History   First MD Initiated Contact with Patient 03/07/15 0413     Chief Complaint  Patient presents with  . Back Pain     (Consider location/radiation/quality/duration/timing/severity/associated sxs/prior Treatment) Patient is a 69 y.o. female presenting with back pain. The history is provided by the patient. No language interpreter was used.  Back Pain Location:  Lumbar spine (right low back) Quality: "it just hurts" Radiates to: to abdomen. Pain severity:  Severe (10/10) Onset quality:  Gradual Duration:  5 days Context: lifting heavy objects   Relieved by:  Heating pad, muscle relaxants and NSAIDs Worsened by:  Movement Ineffective treatments:  None tried Associated symptoms: abdominal pain and tingling   Associated symptoms: no bladder incontinence, no bowel incontinence, no dysuria, no fever, no headaches, no leg pain, no numbness, no paresthesias, no pelvic pain, no perianal numbness and no weakness   Risk factors: no hx of cancer    Pt is a 69 year-old female who complains of low back pain with radiation to her abdomen that has been constant since lifting heavy objects 5 days ago.  Her pain is worse with movement.  She has had no relief with heating pad, muscle relaxants and NSAIDS.  She denies numbness, urinary sx, flank pain, fever, chills, N, V, D, weakness.  She denies hx of IV drug use or personal cancer history.   Past Medical History  Diagnosis Date  . Hypertension   . Hyperlipidemia   . Obesity   . Paresthesias     L and R hand   . Diabetic neuropathy ([place])   . Glaucoma   . Carpal tunnel syndrome, left     mild  . Diabetes mellitus with neurological manifestations, controlled ([place])     diabetic with peripheral neuropathy   History reviewed. No pertinent past surgical history. History reviewed. No pertinent family history. Social History  Substance Use Topics  . Smoking status: Never Smoker   .  Smokeless tobacco: None  . Alcohol Use: 0.6 oz/week    1 Glasses of wine per week     Comment: socially   OB History    No data available     Review of Systems  Constitutional: Negative for fever, chills and diaphoresis.  HENT: Negative.   Respiratory: Negative.   Cardiovascular: Negative.   Gastrointestinal: Positive for abdominal pain. Negative for bowel incontinence.  Genitourinary: Negative for bladder incontinence, dysuria, urgency, decreased urine volume and pelvic pain.  Musculoskeletal: Positive for back pain. Negative for neck pain and neck stiffness.  Skin: Negative.  Negative for rash.  Neurological: Positive for tingling. Negative for weakness, numbness, headaches and paresthesias.      Allergies  Apple  Home Medications   Prior to Admission medications   Medication Sig Start Date End Date Taking? Authorizing Provider  lisinopril (PRINIVIL,ZESTRIL) 20 MG tablet Take 20 mg by mouth daily.   Yes Historical Provider, MD  metFORMIN (GLUCOPHAGE) 1000 MG tablet Take 1,000 mg by mouth 2 (two) times daily with a meal.   Yes Historical Provider, MD  naproxen (NAPROSYN) 500 MG tablet Take 1 tablet (500 mg total) by mouth 2 (two) times daily. Patient taking differently: Take 500 mg by mouth 2 (two) times daily as needed for moderate pain.  12/07/14  Yes Tanna Furry, MD  cyclobenzaprine (FLEXERIL) 10 MG tablet Take 1 tablet (10 mg total) by mouth 2 (two) times daily as needed for muscle spasms. 03/07/15  Delsa Grana, PA-C  diclofenac sodium (VOLTAREN) 1 % GEL Apply 2 g topically 4 (four) times daily. 03/08/15   Shawn C Joy, PA-C  ibuprofen (ADVIL,MOTRIN) 800 MG tablet Take 1 tablet (800 mg total) by mouth 3 (three) times daily. 03/08/15   Shawn C Joy, PA-C  tiZANidine (ZANAFLEX) 2 MG tablet Take 1 tablet (2 mg total) by mouth every 6 (six) hours as needed for muscle spasms. 03/08/15   Shawn C Joy, PA-C  traMADol (ULTRAM) 50 MG tablet Take 1 tablet (50 mg total) by mouth every 6 (six)  hours as needed. 03/07/15   Delsa Grana, PA-C   BP 142/68 mmHg  Pulse 70  Temp(Src) 98.3 F (36.8 C) (Oral)  Resp 18  Ht 5\' 6"  (1.676 m)  Wt 190 lb (86.183 kg)  BMI 30.68 kg/m2  SpO2 97% Physical Exam  Constitutional: She is oriented to person, place, and time. She appears well-developed and well-nourished. No distress.  HENT:  Head: Normocephalic and atraumatic.  Right Ear: External ear normal.  Left Ear: External ear normal.  Nose: Nose normal.  Mouth/Throat: Oropharynx is clear and moist. No oropharyngeal exudate.  Eyes: Conjunctivae and EOM are normal. Pupils are equal, round, and reactive to light. Right eye exhibits no discharge. Left eye exhibits no discharge. No scleral icterus.  Neck: Normal range of motion. Neck supple. No JVD present. No tracheal deviation present.  Cardiovascular: Normal rate and regular rhythm.   Pulmonary/Chest: Effort normal and breath sounds normal. No stridor. No respiratory distress.  Abdominal: Soft. Bowel sounds are normal. She exhibits no distension. There is no tenderness. There is no rebound and no guarding.  Musculoskeletal: Normal range of motion. She exhibits no edema.       Cervical back: Normal.       Thoracic back: Normal.       Lumbar back: She exhibits tenderness. She exhibits normal range of motion, no bony tenderness, no swelling, no edema and no deformity.       Back:  Lymphadenopathy:    She has no cervical adenopathy.  Neurological: She is alert and oriented to person, place, and time. She has normal strength. She is not disoriented. She displays no atrophy and no tremor. No cranial nerve deficit or sensory deficit. She exhibits normal muscle tone. She displays a negative Romberg sign. Coordination normal.  Speech is clear and goal oriented, follows commands Major Cranial nerves without deficit, no facial droop Normal strength in upper and lower extremities bilaterally including dorsiflexion and plantar flexion, strong and equal  grip strength Sensation normal to light and sharp touch Moves extremities without ataxia, coordination intact Normal finger to nose and rapid alternating movements Neg romberg, no pronator drift Normal gait and balance   Skin: Skin is warm and dry. No rash noted. She is not diaphoretic. No erythema. No pallor.  Psychiatric: She has a normal mood and affect. Her behavior is normal. Judgment and thought content normal.    ED Course  Procedures (including critical care time) Labs Review Labs Reviewed  URINALYSIS, ROUTINE W REFLEX MICROSCOPIC (NOT AT [facility]) - Abnormal; Notable for the following:    Specific Gravity, Urine 1.004 (*)    All other components within normal limits    Imaging Review No results found. I have personally reviewed and evaluated these images and lab results as part of my medical decision-making.   EKG Interpretation None      MDM   Final diagnoses:  Right-sided low back pain without sciatica  Patient with back pain.  She complains of radiation to abdomen, however has no flank pain or abdominal tenderness.  UA negative for UTI. Without tenderness, no indication for further imaging.  Her back pain is consistent with MSK strain.   No neurological deficits and normal neuro exam.  Patient can walk but states is painful.  No loss of bowel or bladder control.  No concern for cauda equina.  No fever, night sweats, weight loss, h/o cancer, IVDU.  RICE protocol and pain medicine indicated and discussed with patient.  Given flexeril and tramadol in the ER and d/c home.  Filed Vitals:   03/07/15 0353 03/07/15 0737  BP: 171/73 142/68  Pulse: 70   Temp: 98.3 F (36.8 C)   TempSrc: Oral   Resp: 20 18  Height: 5\' 6"  (1.676 m)   Weight: 190 lb (86.183 kg)   SpO2: 98% 97%        Delsa Grana, PA-C 03/23/15 0342  April Palumbo, MD 03/24/15 [phone]

## 2015-03-07 NOTE — ED Notes (Signed)
Awake. Verbally responsive. A/O x4. Resp even and unlabored. No audible adventitious breath sounds noted. ABC's intact.  

## 2015-03-07 NOTE — Discharge Instructions (Signed)

## 2015-03-07 NOTE — ED Notes (Signed)
Pt complains of lower back pain that radiates to her lower abdomen, no relief with OTC medication, no recent injury

## 2015-03-07 NOTE — ED Notes (Signed)
Denies urinary systems

## 2015-03-07 NOTE — ED Notes (Signed)
Pt complains low back pain been going on all week but worse tonight,  No known injury

## 2015-03-08 ENCOUNTER — Encounter (HOSPITAL_COMMUNITY): Payer: Self-pay | Admitting: Oncology

## 2015-03-08 ENCOUNTER — Emergency Department (HOSPITAL_COMMUNITY): Payer: Medicare Other

## 2015-03-08 ENCOUNTER — Emergency Department (HOSPITAL_COMMUNITY)
Admission: EM | Admit: 2015-03-08 | Discharge: 2015-03-08 | Disposition: A | Discharge status: Home / Self Care | Payer: Medicare Other | Attending: Emergency Medicine | Admitting: Emergency Medicine

## 2015-03-08 DIAGNOSIS — Z79899 Other long term (current) drug therapy: Secondary | ICD-10-CM | POA: Insufficient documentation

## 2015-03-08 DIAGNOSIS — R1031 Right lower quadrant pain: Secondary | ICD-10-CM | POA: Diagnosis not present

## 2015-03-08 DIAGNOSIS — E669 Obesity, unspecified: Secondary | ICD-10-CM | POA: Diagnosis not present

## 2015-03-08 DIAGNOSIS — Z7984 Long term (current) use of oral hypoglycemic drugs: Secondary | ICD-10-CM | POA: Insufficient documentation

## 2015-03-08 DIAGNOSIS — E1140 Type 2 diabetes mellitus with diabetic neuropathy, unspecified: Secondary | ICD-10-CM | POA: Insufficient documentation

## 2015-03-08 DIAGNOSIS — H409 Unspecified glaucoma: Secondary | ICD-10-CM | POA: Diagnosis not present

## 2015-03-08 DIAGNOSIS — R109 Unspecified abdominal pain: Secondary | ICD-10-CM

## 2015-03-08 DIAGNOSIS — Z8669 Personal history of other diseases of the nervous system and sense organs: Secondary | ICD-10-CM | POA: Insufficient documentation

## 2015-03-08 DIAGNOSIS — I10 Essential (primary) hypertension: Secondary | ICD-10-CM | POA: Insufficient documentation

## 2015-03-08 LAB — CBC WITH DIFFERENTIAL/PLATELET
BASOS ABS: 0 10*3/uL (ref 0.0–0.1)
BASOS PCT: 1 %
EOS ABS: 0.2 10*3/uL (ref 0.0–0.7)
Eosinophils Relative: 3 %
HEMATOCRIT: 39.7 % (ref 36.0–46.0)
HEMOGLOBIN: 12.8 g/dL (ref 12.0–15.0)
Lymphocytes Relative: 44 %
Lymphs Abs: 2.9 10*3/uL (ref 0.7–4.0)
MCH: 28 pg (ref 26.0–34.0)
MCHC: 32.2 g/dL (ref 30.0–36.0)
MCV: 86.9 fL (ref 78.0–100.0)
MONOS PCT: 6 %
Monocytes Absolute: 0.4 10*3/uL (ref 0.1–1.0)
NEUTROS ABS: 3 10*3/uL (ref 1.7–7.7)
NEUTROS PCT: 46 %
Platelets: 262 10*3/uL (ref 150–400)
RBC: 4.57 MIL/uL (ref 3.87–5.11)
RDW: 13.8 % (ref 11.5–15.5)
WBC: 6.6 10*3/uL (ref 4.0–10.5)

## 2015-03-08 LAB — URINALYSIS, ROUTINE W REFLEX MICROSCOPIC
Bilirubin Urine: NEGATIVE
GLUCOSE, UA: NEGATIVE mg/dL
Hgb urine dipstick: NEGATIVE
Ketones, ur: NEGATIVE mg/dL
LEUKOCYTES UA: NEGATIVE
NITRITE: NEGATIVE
PH: 6.5 (ref 5.0–8.0)
Protein, ur: NEGATIVE mg/dL
SPECIFIC GRAVITY, URINE: 1.015 (ref 1.005–1.030)
Urobilinogen, UA: 0.2 mg/dL (ref 0.0–1.0)

## 2015-03-08 LAB — COMPREHENSIVE METABOLIC PANEL
ALBUMIN: 4.1 g/dL (ref 3.5–5.0)
ALK PHOS: 65 U/L (ref 38–126)
ALT: 17 U/L (ref 14–54)
AST: 21 U/L (ref 15–41)
Anion gap: 7 (ref 5–15)
BILIRUBIN TOTAL: 0.9 mg/dL (ref 0.3–1.2)
BUN: 14 mg/dL (ref 6–20)
CALCIUM: 9.1 mg/dL (ref 8.9–10.3)
CO2: 26 mmol/L (ref 22–32)
CREATININE: 0.8 mg/dL (ref 0.44–1.00)
Chloride: 108 mmol/L (ref 101–111)
GFR calc Af Amer: >60 mL/min (ref >60)
GFR calc non Af Amer: >60 mL/min (ref >60)
GLUCOSE: 124 mg/dL — AB (ref 65–99)
Potassium: 4.1 mmol/L (ref 3.5–5.1)
SODIUM: 141 mmol/L (ref 135–145)
TOTAL PROTEIN: 7.5 g/dL (ref 6.5–8.1)

## 2015-03-08 MED ORDER — TIZANIDINE HCL 2 MG PO TABS: 2.0000 mg | ORAL_TABLET | Freq: Four times a day (QID) | ORAL | Status: DC | PRN

## 2015-03-08 MED ORDER — IBUPROFEN 800 MG PO TABS: 800.0000 mg | ORAL_TABLET | Freq: Three times a day (TID) | ORAL | Status: AC

## 2015-03-08 MED ORDER — SODIUM CHLORIDE 0.9 % IV BOLUS (SEPSIS)
1000.0000 mL | Freq: Once | INTRAVENOUS | Status: AC
  Administered 2015-03-08: 1000 mL via INTRAVENOUS

## 2015-03-08 MED ORDER — KETOROLAC TROMETHAMINE 30 MG/ML IJ SOLN
30.0000 mg | Freq: Once | INTRAMUSCULAR | Status: AC
  Administered 2015-03-08: 30 mg via INTRAVENOUS
  Filled 2015-03-08: qty 1

## 2015-03-08 MED ORDER — DICLOFENAC SODIUM 1 % TD GEL: 2.0000 g | Freq: Four times a day (QID) | TRANSDERMAL | Status: DC

## 2015-03-08 NOTE — ED Provider Notes (Signed)
Medical screening examination/treatment/procedure(s) were performed by non-physician practitioner and as supervising physician I was immediately available for consultation/collaboration.   EKG Interpretation None       Manvir Thorson, MD 03/08/15 [phone]

## 2015-03-08 NOTE — ED Notes (Addendum)
Pt reports taking flexeril and tramadol that was prescribed on 11/5 without relief. Denies urinary symtom. Pt reports that her pain is constant. Pt report one episode of diarrhea yesterday, denies vomiting. No palpable pain.

## 2015-03-08 NOTE — ED Notes (Addendum)
Pt provided with heat pack for pain relief  

## 2015-03-08 NOTE — ED Provider Notes (Signed)
CSN: [id number]     Arrival date & time 03/08/15  0554 History   First MD Initiated Contact with Patient 03/08/15 [phone]     Chief Complaint  Patient presents with  . Flank Pain     (Consider location/radiation/quality/duration/timing/severity/associated sxs/prior Treatment) HPI   Jenny Wolf is a 69 y.o. female, pt with a history of NIDDM, presenting to the ED with right flank pain beginning suddenly yesterday. Pt rates the pain 10/10, describes it as achy, and radiates to her RLQ of her abdomen. Pt tried taking her home back pain medications, flexeril and tramadol, this morning with no effect. Pt was seen here in this ED yesterday for an acute exacerbation of her lower back pain, had her pain treated, and was discharged. Pt states the pain today is different than any other pain she's ever had. No history of abdominal surgeries other than c-section, N/V/C, urinary symptoms, vaginal bleeding or discharge, or any other pain or complaints.   Past Medical History  Diagnosis Date  . Hypertension   . Hyperlipidemia   . Obesity   . Paresthesias     L and R hand   . Diabetic neuropathy ([place])   . Glaucoma   . Carpal tunnel syndrome, left     mild  . Diabetes mellitus with neurological manifestations, controlled ([place])     diabetic with peripheral neuropathy   History reviewed. No pertinent past surgical history. No family history on file. Social History  Substance Use Topics  . Smoking status: Never Smoker   . Smokeless tobacco: None  . Alcohol Use: 0.6 oz/week    1 Glasses of wine per week     Comment: socially   OB History    No data available     Review of Systems  Constitutional: Negative for fever, chills, diaphoresis and unexpected weight change.  Respiratory: Negative for cough, chest tightness and shortness of breath.   Cardiovascular: Negative for chest pain, palpitations and leg swelling.  Gastrointestinal: Positive for abdominal pain. Negative for nausea, vomiting,  constipation and blood in stool.  Genitourinary: Positive for flank pain. Negative for dysuria, urgency, frequency, hematuria, decreased urine volume, vaginal bleeding, vaginal discharge, difficulty urinating, vaginal pain and pelvic pain.  Musculoskeletal: Negative for back pain.  Skin: Negative for color change and pallor.  Neurological: Negative for dizziness, syncope, weakness and light-headedness.  All other systems reviewed and are negative.     Allergies  Apple  Home Medications   Prior to Admission medications   Medication Sig Start Date End Date Taking? Authorizing Provider  cyclobenzaprine (FLEXERIL) 10 MG tablet Take 1 tablet (10 mg total) by mouth 2 (two) times daily as needed for muscle spasms. 03/07/15  Yes Delsa Grana, PA-C  lisinopril (PRINIVIL,ZESTRIL) 20 MG tablet Take 20 mg by mouth daily.   Yes Historical Provider, MD  metFORMIN (GLUCOPHAGE) 1000 MG tablet Take 1,000 mg by mouth 2 (two) times daily with a meal.   Yes Historical Provider, MD  naproxen (NAPROSYN) 500 MG tablet Take 1 tablet (500 mg total) by mouth 2 (two) times daily. Patient taking differently: Take 500 mg by mouth 2 (two) times daily as needed for moderate pain.  12/07/14  Yes Tanna Furry, MD  traMADol (ULTRAM) 50 MG tablet Take 1 tablet (50 mg total) by mouth every 6 (six) hours as needed. 03/07/15  Yes Delsa Grana, PA-C  diclofenac sodium (VOLTAREN) 1 % GEL Apply 2 g topically 4 (four) times daily. 03/08/15   Lorayne Bender, PA-C  ibuprofen (ADVIL,MOTRIN) 800 MG tablet Take 1 tablet (800 mg total) by mouth 3 (three) times daily. 03/08/15   Shawn C Joy, PA-C  tiZANidine (ZANAFLEX) 2 MG tablet Take 1 tablet (2 mg total) by mouth every 6 (six) hours as needed for muscle spasms. 03/08/15   Shawn C Joy, PA-C   BP 183/78 mmHg  Pulse 76  Temp(Src) 98.1 F (36.7 C) (Oral)  Resp 20  Ht 5\' 6"  (1.676 m)  Wt 192 lb (87.091 kg)  BMI 31.00 kg/m2  SpO2 97% Physical Exam  Constitutional: She appears well-developed and  well-nourished. No distress.  HENT:  Head: Normocephalic and atraumatic.  Eyes: Conjunctivae are normal. Pupils are equal, round, and reactive to light.  Cardiovascular: Normal rate, regular rhythm and normal heart sounds.   Pulmonary/Chest: Effort normal and breath sounds normal. No respiratory distress.  Abdominal: Soft. Bowel sounds are normal. There is no tenderness. There is CVA tenderness. There is no rigidity, no guarding, no tenderness at McBurney's point and negative Murphy's sign.  CVA tenderness on right.  Musculoskeletal: She exhibits no edema or tenderness.  No paraspinal tenderness. Full ROM in spine and extremities. Mild tenderness over right musculature of L-spine.  Neurological: She is alert.  No sensory deficits. Strength 5/5 in all extremities. No gait disturbance. Cranial nerves II-XII grossly intact.  Skin: Skin is warm and dry. She is not diaphoretic.  Nursing note and vitals reviewed.   ED Course  Procedures (including critical care time) Labs Review Labs Reviewed  COMPREHENSIVE METABOLIC PANEL - Abnormal; Notable for the following:    Glucose, Bld 124 (*)    All other components within normal limits  URINALYSIS, ROUTINE W REFLEX MICROSCOPIC (NOT AT [facility])  CBC WITH DIFFERENTIAL/PLATELET    Imaging Review Ct Renal Stone Study  03/08/2015  CLINICAL DATA:  69 year old female with right-sided flank and abdominal pain that radiates to the lower abdomen. EXAM: CT ABDOMEN AND PELVIS WITHOUT CONTRAST TECHNIQUE: Multidetector CT imaging of the abdomen and pelvis was performed following the standard protocol without IV contrast. COMPARISON:  No priors. FINDINGS: Lower chest:  Unremarkable. Hepatobiliary: Mild diffuse low attenuation throughout the hepatic parenchyma, compatible with hepatic steatosis. No discrete cystic or solid hepatic lesions are confidently identified on today's noncontrast CT examination. The unenhanced appearance of the gallbladder is normal. Pancreas:  No pancreatic mass or peripancreatic inflammatory changes identified on today's noncontrast CT examination. Spleen: Unremarkable. Adrenals/Urinary Tract: There are no abnormal calcifications within the collecting system of either kidney, along the course of either ureter, or within the lumen of the urinary bladder. No hydroureteronephrosis or perinephric stranding to suggest urinary tract obstruction at this time. The unenhanced appearance of the kidneys is unremarkable bilaterally. Unenhanced appearance of the urinary bladder is normal. Bilateral adrenal glands are normal in appearance. Stomach/Bowel: Normal appearance of the stomach. No pathologic dilatation of small bowel or colon. Normal appendix. Vascular/Lymphatic: Mild atherosclerotic calcifications throughout the abdominal and pelvic vasculature, without evidence of aneurysm. Multiple borderline enlarged and mildly enlarged lymph nodes are noted throughout the small bowel mesentery, largest which measures up to 12 mm (image 51 of series 2). Slight haziness in the fat of the small bowel mesentery. No other lymphadenopathy is noted. Reproductive: Status post hysterectomy. Ovaries are unremarkable in appearance. Other: No significant volume of ascites.  No pneumoperitoneum. Musculoskeletal: There are no aggressive appearing lytic or blastic lesions noted in the visualized portions of the skeleton. IMPRESSION: 1. Slight haziness in the fat of the small bowel mesentery, with multiple  borderline enlarged and minimally enlarged mesenteric lymph nodes. These findings are nonspecific, but could suggest mild mesenteric panniculitis. 2. No other acute findings noted in the abdomen or pelvis. Specifically, no urinary tract calculi or findings of urinary tract obstruction. 3. Normal appendix. 4. Mild hepatic steatosis. 5. Atherosclerosis. Electronically Signed   By: Vinnie Langton M.D.   On: 03/08/2015 08:20   I have personally reviewed and evaluated these images and  lab results as part of my medical decision-making.   EKG Interpretation None      MDM   Final diagnoses:  Right flank pain    Jenny Wolf presents with right flank pain radiating into her lower right abdominal quadrant since early this morning.  Findings and plan of care discussed with Davonna Belling, MD.  Suspect MSK back pain vs renal stone vs shingles pain pre-rash. CT renal stone study ordered along with pain medications. UA and CBC are free from abnormalities. CMP without significant abnormality. Renal stone study is clear, patient shows no indication for further imaging at this time, and will be treated for musculoskeletal pain and inform pt of possibility of shingles outbreak. 8:32 AM CT reveals no renal stone or other significant abnormalites. Pt reassessed and notified of CT findings. Pt is now pain-free. Pt to be treated for MSK pain with instructions to follow up with her PCP. Communicated plan of care with pt, who agreed to the plan and is comfortable with discharge.    Lorayne Bender, PA-C 03/08/15 [place], MD 03/09/15 [phone]

## 2015-03-08 NOTE — Discharge Instructions (Signed)
You have been seen today for back and flank pain. Your imaging and lab tests showed no abnormalities. Follow up with PCP for chronic management of this problem. Return to ED should symptoms worsen.

## 2015-03-08 NOTE — ED Notes (Signed)
Pt seen here last night for right sided flank pain that radiates to lower abdomen.  Pt presents again tonight requesting imagine to be done because the rxs given 10/5 are not helping.  Pt rates pain 10/10 constant aching.

## 2015-03-12 DIAGNOSIS — M545 Low back pain: Secondary | ICD-10-CM | POA: Diagnosis not present

## 2015-03-13 DIAGNOSIS — H401113 Primary open-angle glaucoma, right eye, severe stage: Secondary | ICD-10-CM | POA: Diagnosis not present

## 2015-03-13 DIAGNOSIS — E119 Type 2 diabetes mellitus without complications: Secondary | ICD-10-CM | POA: Diagnosis not present

## 2015-03-13 DIAGNOSIS — H401122 Primary open-angle glaucoma, left eye, moderate stage: Secondary | ICD-10-CM | POA: Diagnosis not present

## 2015-03-13 DIAGNOSIS — H2513 Age-related nuclear cataract, bilateral: Secondary | ICD-10-CM | POA: Diagnosis not present

## 2015-03-19 DIAGNOSIS — H53431 Sector or arcuate defects, right eye: Secondary | ICD-10-CM | POA: Diagnosis not present

## 2015-03-19 DIAGNOSIS — H2513 Age-related nuclear cataract, bilateral: Secondary | ICD-10-CM | POA: Diagnosis not present

## 2015-03-19 DIAGNOSIS — H401113 Primary open-angle glaucoma, right eye, severe stage: Secondary | ICD-10-CM | POA: Diagnosis not present

## 2015-03-19 DIAGNOSIS — H3589 Other specified retinal disorders: Secondary | ICD-10-CM | POA: Diagnosis not present

## 2015-03-19 DIAGNOSIS — H401122 Primary open-angle glaucoma, left eye, moderate stage: Secondary | ICD-10-CM | POA: Diagnosis not present

## 2015-04-15 IMAGING — US US THYROID BIOPSY
1 series · 13 of 13 positions shown · non-contrast
Comparison: 01/02/2014

CLINICAL DATA: Dominant left midpole thyroid nodule

EXAM:
ULTRASOUND GUIDED NEEDLE ASPIRATE BIOPSY OF THE THYROID GLAND

[Series 1: us thyroid biopsy · 0.07mm/px · 13 acquisitions, 13 frames shown]
[im 1/13]
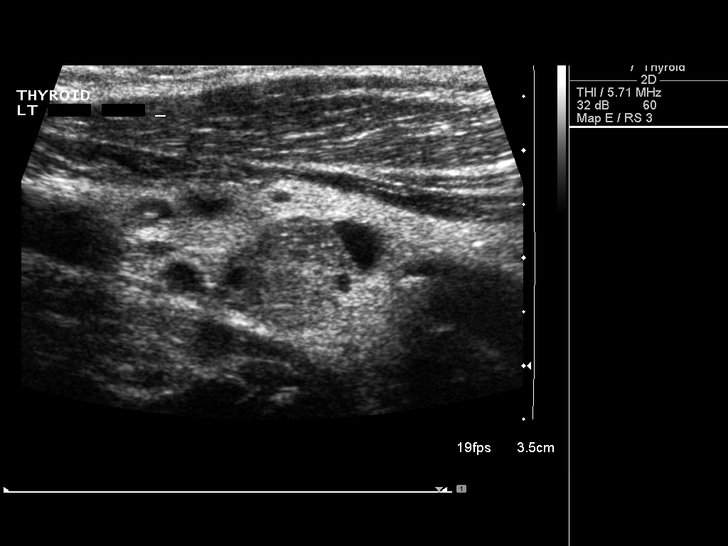
[im 2/13]
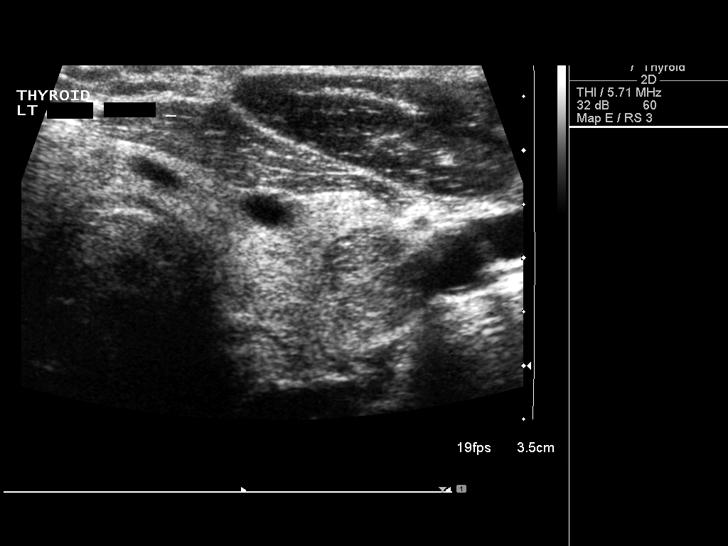
[im 3/13]
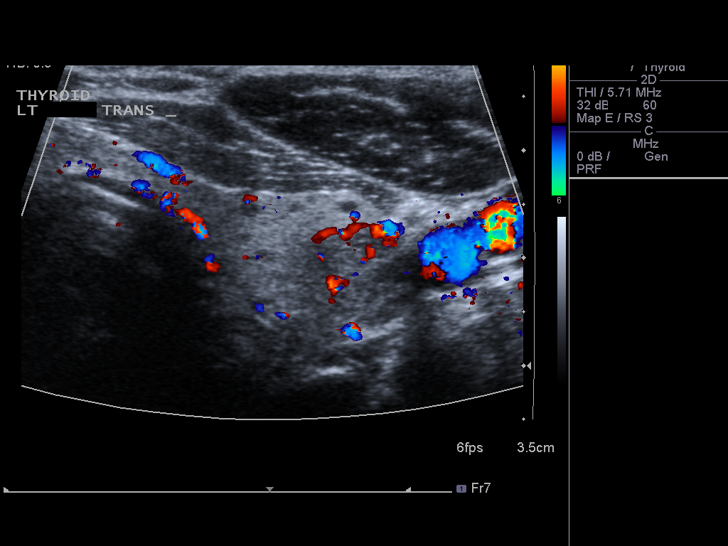
[im 4/13]
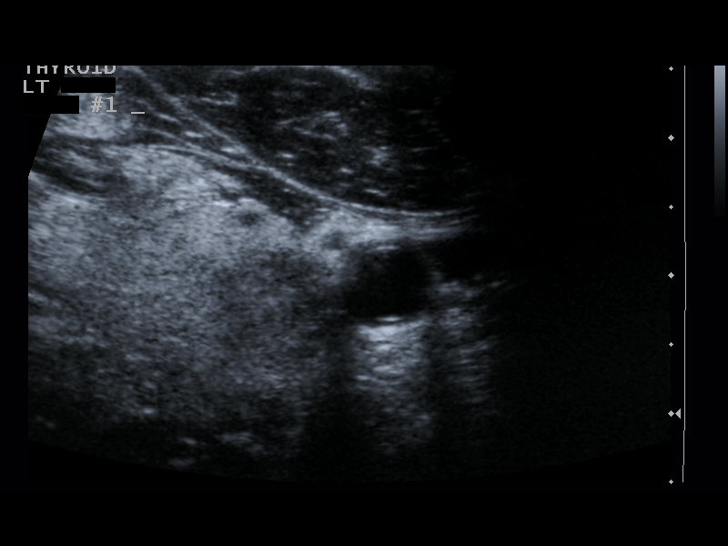
[im 5/13]
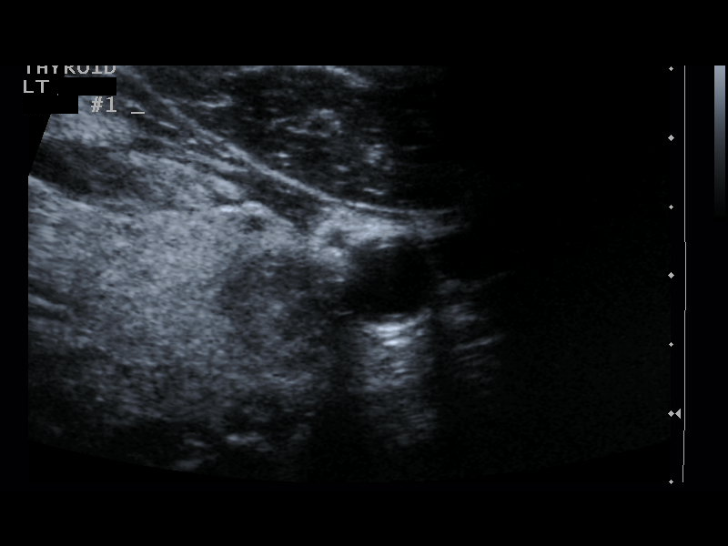
[im 6/13]
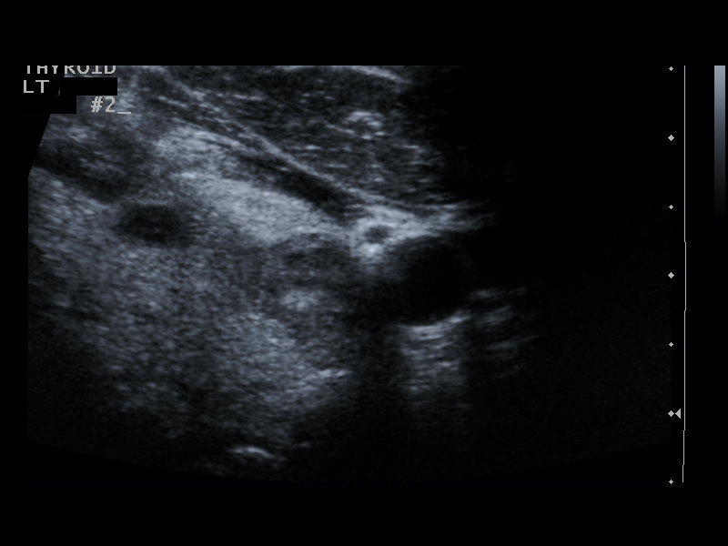
[im 7/13]
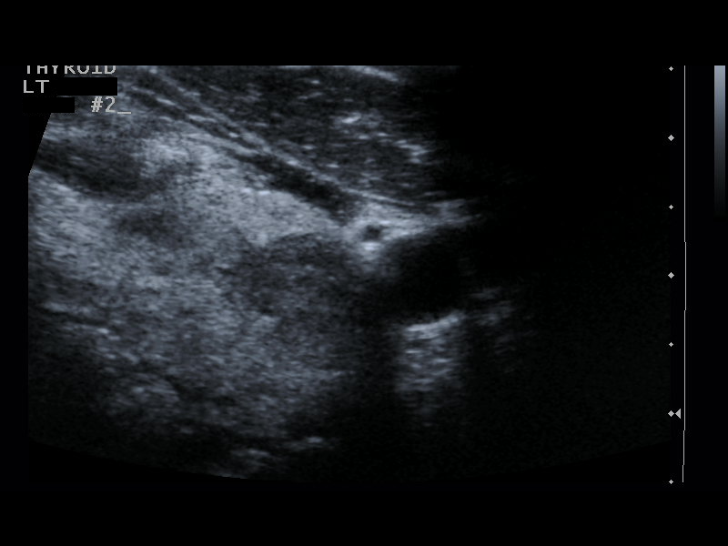
[im 8/13]
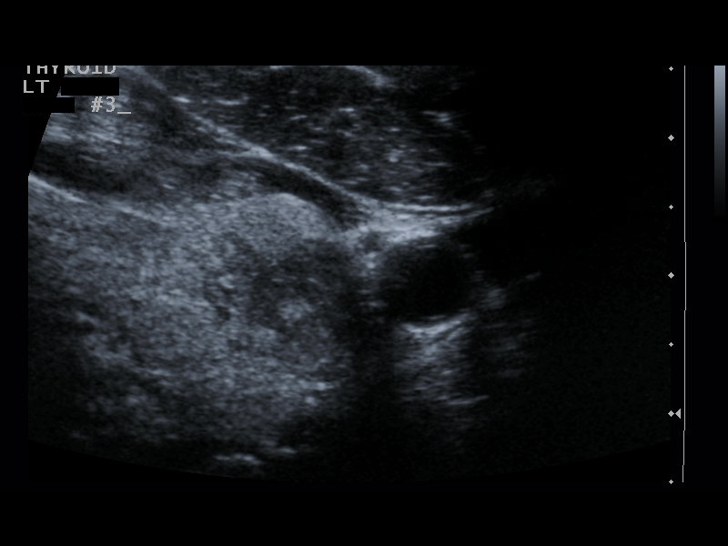
[im 9/13]
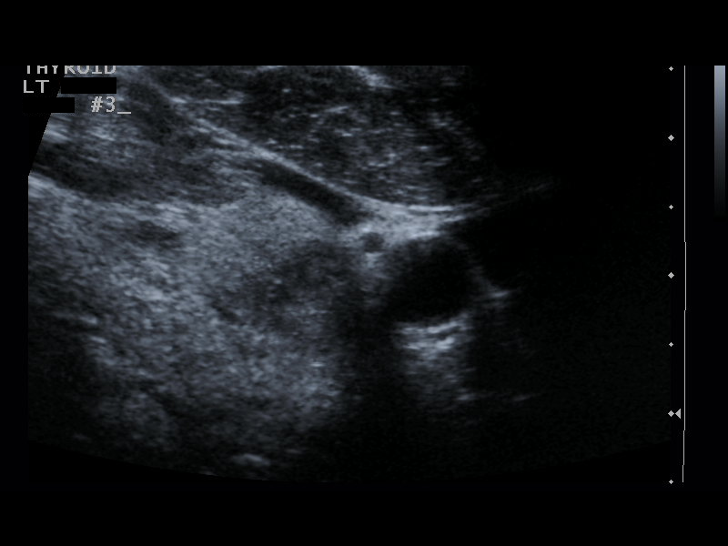
[im 10/13]
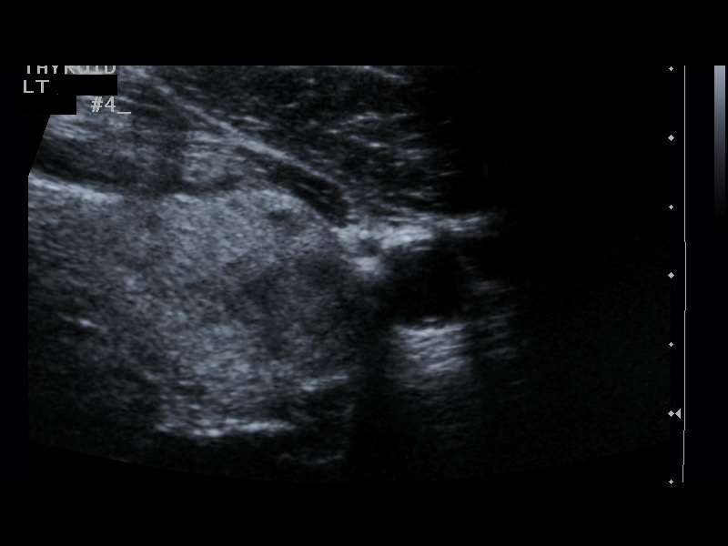
[im 11/13]
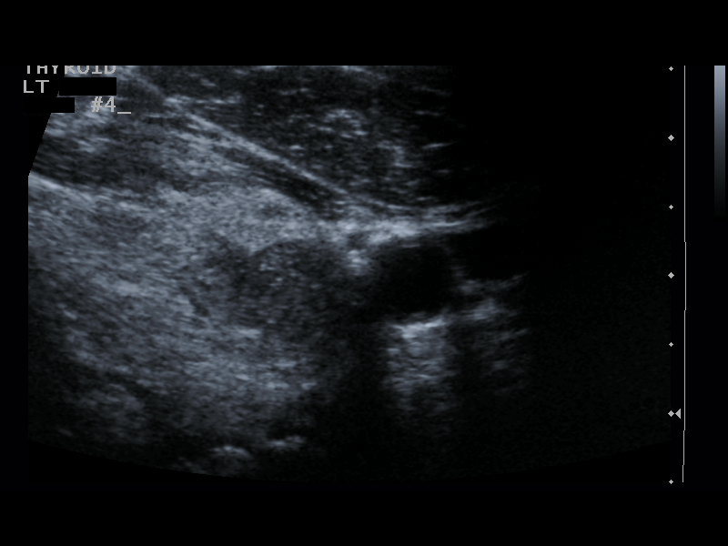
[im 12/13]
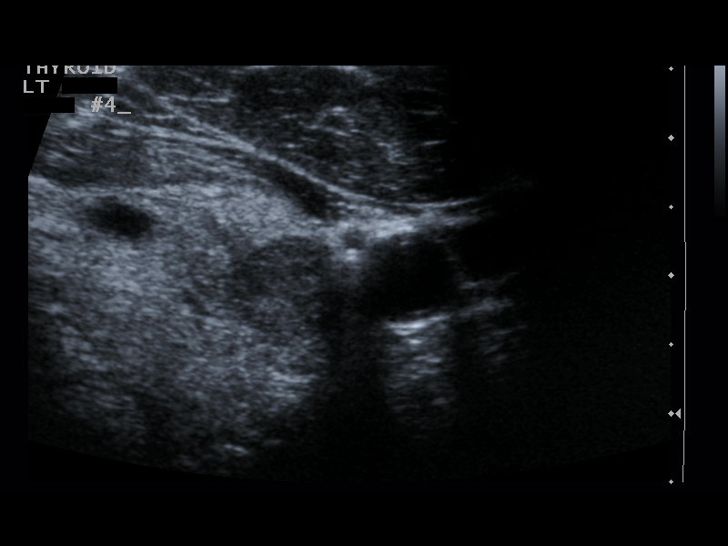
[im 13/13]
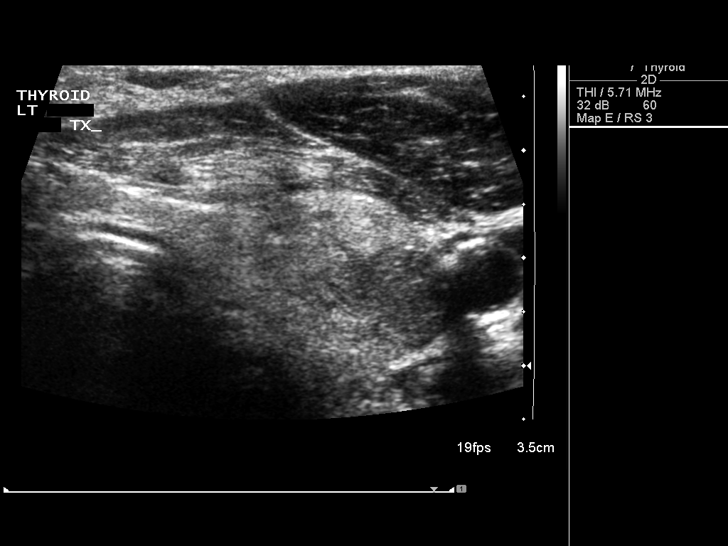

[13 of 13 positions shown; findings below may reference images not displayed]

PROCEDURE:
Thyroid biopsy was thoroughly discussed with the patient and
questions were answered. The benefits, risks, alternatives, and
complications were also discussed. The patient understands and
wishes to proceed with the procedure. Written consent was obtained.



Complications:  No immediate
FINDINGS: Imaging confirms needle placed in the dominant left midpole thyroid
nodule
IMPRESSION: Ultrasound guided needle aspirate biopsy performed of the dominant
left midpole thyroid nodule.

## 2015-04-16 DIAGNOSIS — H401122 Primary open-angle glaucoma, left eye, moderate stage: Secondary | ICD-10-CM | POA: Diagnosis not present

## 2015-04-16 DIAGNOSIS — H53431 Sector or arcuate defects, right eye: Secondary | ICD-10-CM | POA: Diagnosis not present

## 2015-04-16 DIAGNOSIS — H401113 Primary open-angle glaucoma, right eye, severe stage: Secondary | ICD-10-CM | POA: Diagnosis not present

## 2015-06-04 ENCOUNTER — Other Ambulatory Visit: Payer: Self-pay | Admitting: Family Medicine

## 2015-06-04 DIAGNOSIS — E041 Nontoxic single thyroid nodule: Secondary | ICD-10-CM

## 2015-06-05 ENCOUNTER — Other Ambulatory Visit: Payer: Self-pay | Admitting: Family Medicine

## 2015-06-05 DIAGNOSIS — E2839 Other primary ovarian failure: Secondary | ICD-10-CM

## 2015-06-05 DIAGNOSIS — Z1231 Encounter for screening mammogram for malignant neoplasm of breast: Secondary | ICD-10-CM

## 2015-06-16 ENCOUNTER — Ambulatory Visit
Admission: RE | Admit: 2015-06-16 | Discharge: 2015-06-16 | Disposition: A | Discharge status: Home / Self Care | Payer: Medicare Other | Source: Ambulatory Visit | Attending: Family Medicine | Admitting: Family Medicine

## 2015-06-16 DIAGNOSIS — E041 Nontoxic single thyroid nodule: Secondary | ICD-10-CM

## 2015-06-30 ENCOUNTER — Ambulatory Visit
Admission: RE | Admit: 2015-06-30 | Discharge: 2015-06-30 | Disposition: A | Discharge status: Home / Self Care | Payer: Medicare Other | Source: Ambulatory Visit | Attending: Family Medicine | Admitting: Family Medicine

## 2015-06-30 DIAGNOSIS — Z1231 Encounter for screening mammogram for malignant neoplasm of breast: Secondary | ICD-10-CM

## 2015-06-30 DIAGNOSIS — E2839 Other primary ovarian failure: Secondary | ICD-10-CM

## 2015-09-16 ENCOUNTER — Ambulatory Visit: Payer: Medicare Other | Admitting: Podiatry

## 2016-05-20 ENCOUNTER — Other Ambulatory Visit: Payer: Self-pay | Admitting: Family

## 2016-05-20 DIAGNOSIS — E041 Nontoxic single thyroid nodule: Secondary | ICD-10-CM

## 2016-05-31 ENCOUNTER — Other Ambulatory Visit: Payer: Medicare Other

## 2016-06-01 ENCOUNTER — Ambulatory Visit
Admission: RE | Admit: 2016-06-01 | Discharge: 2016-06-01 | Disposition: A | Discharge status: Home / Self Care | Payer: Medicare Other | Source: Ambulatory Visit | Attending: Family | Admitting: Family

## 2016-06-01 DIAGNOSIS — E041 Nontoxic single thyroid nodule: Secondary | ICD-10-CM

## 2016-07-01 ENCOUNTER — Other Ambulatory Visit: Payer: Self-pay | Admitting: Family

## 2016-07-01 DIAGNOSIS — E041 Nontoxic single thyroid nodule: Secondary | ICD-10-CM

## 2016-08-02 ENCOUNTER — Other Ambulatory Visit: Payer: Self-pay | Admitting: Family Medicine

## 2016-08-02 DIAGNOSIS — Z1231 Encounter for screening mammogram for malignant neoplasm of breast: Secondary | ICD-10-CM

## 2016-08-23 ENCOUNTER — Ambulatory Visit
Admission: RE | Admit: 2016-08-23 | Discharge: 2016-08-23 | Disposition: A | Discharge status: Home / Self Care | Payer: Medicare Other | Source: Ambulatory Visit | Attending: Family | Admitting: Family

## 2016-08-23 DIAGNOSIS — Z1231 Encounter for screening mammogram for malignant neoplasm of breast: Secondary | ICD-10-CM

## 2016-09-01 ENCOUNTER — Other Ambulatory Visit (HOSPITAL_COMMUNITY)
Admission: RE | Admit: 2016-09-01 | Discharge: 2016-09-01 | Disposition: A | Discharge status: Home / Self Care | Payer: Medicare Other | Source: Ambulatory Visit | Attending: Student | Admitting: Student

## 2016-09-01 ENCOUNTER — Ambulatory Visit
Admission: RE | Admit: 2016-09-01 | Discharge: 2016-09-01 | Disposition: A | Discharge status: Home / Self Care | Payer: Medicare Other | Source: Ambulatory Visit | Attending: Family | Admitting: Family

## 2016-09-01 DIAGNOSIS — E041 Nontoxic single thyroid nodule: Secondary | ICD-10-CM | POA: Insufficient documentation

## 2017-11-07 ENCOUNTER — Encounter: Payer: Medicare Other | Attending: Family Medicine | Admitting: *Deleted

## 2017-11-07 DIAGNOSIS — Z6831 Body mass index (BMI) 31.0-31.9, adult: Secondary | ICD-10-CM | POA: Diagnosis not present

## 2017-11-07 DIAGNOSIS — E1165 Type 2 diabetes mellitus with hyperglycemia: Secondary | ICD-10-CM | POA: Insufficient documentation

## 2017-11-07 DIAGNOSIS — Z713 Dietary counseling and surveillance: Secondary | ICD-10-CM | POA: Insufficient documentation

## 2017-11-07 DIAGNOSIS — E1140 Type 2 diabetes mellitus with diabetic neuropathy, unspecified: Secondary | ICD-10-CM

## 2017-11-14 NOTE — Progress Notes (Signed)
Diabetes Self-Management Education  Visit Type: First/Initial  Appt. Start Time: 1530 Appt. End Time: 1700  11/14/2017  [name], identified by name and date of birth, is a 72 y.o. female with a diagnosis of Diabetes: Type 2. Patient states she has had diabetes for many years with no previous diabetes education. She also states her A1c was in the 9% range at last MD appointment in April but is now down in the 7% range. She has not been exercising lately but has gym membership and is eligible for Silver Sneakers so plans to resume exercising now. She has made significant changes in her dietary habits since April as well.   ASSESSMENT  Height 5\' 6"  (1.676 m), weight 194 lb 14.4 oz (88.4 kg). Body mass index is 31.46 kg/m.  Diabetes Self-Management Education - 11/14/17 0850      Visit Information   Visit Type  First/Initial      Initial Visit   Diabetes Type  Type 2    Are you currently following a meal plan?  No    Are you taking your medications as prescribed?  Yes    Date Diagnosed  [place]   How would you rate your overall health?  Fair      Psychosocial Assessment   Patient Belief/Attitude about Diabetes  Motivated to manage diabetes    Other persons present  Patient    Patient Concerns  Nutrition/Meal planning    Special Needs  None    Learning Readiness  Contemplating    How often do you need to have someone help you when you read instructions, pamphlets, or other written materials from your doctor or pharmacy?  1 - Never    What is the last grade level you completed in school?  12      Pre-Education Assessment   Patient understands the diabetes disease and treatment process.  Needs Instruction    Patient understands incorporating nutritional management into lifestyle.  Needs Instruction    Patient undertands incorporating physical activity into lifestyle.  Needs Instruction    Patient understands using medications safely.  Needs Instruction     Patient understands monitoring blood glucose, interpreting and using results  Needs Instruction    Patient understands prevention, detection, and treatment of acute complications.  Needs Instruction    Patient understands prevention, detection, and treatment of chronic complications.  Needs Instruction    Patient understands how to develop strategies to address psychosocial issues.  Needs Instruction    Patient understands how to develop strategies to promote health/change behavior.  Needs Instruction      Complications   Last HgB A1C per patient/outside source  7 %    How often do you check your blood sugar?  1-2 times/day    Fasting Blood glucose range (mg/dL)  70-129    Postprandial Blood glucose range (mg/dL)  70-129;130-179    Have you had a dilated eye exam in the past 12 months?  Yes    Have you had a dental exam in the past 12 months?  Yes    Are you checking your feet?  Yes    How many days per week are you checking your feet?  7      Dietary Intake   Breakfast  high fiber cereal (now) OR oats  with creamer and raisins OR Smoothie made with apple juice, 1/2 banana, protein powder and occasionally flax seed    Lunch  sandwich  Snack (afternoon)  pork rinds or nuts    Dinner  meat and several vegetables, occasionally small serving of starch    Snack (evening)  sweets once or twice a week    Beverage(s)  water,       Exercise   Exercise Type  ADL's    How many days per week to you exercise?  0    How many minutes per day do you exercise?  0    Total minutes per week of exercise  0      Patient Education   Previous Diabetes Education  No    Disease state   Factors that contribute to the development of diabetes    Nutrition management   Role of diet in the treatment of diabetes and the relationship between the three main macronutrients and blood glucose level;Carbohydrate counting;Reviewed blood glucose goals for pre and post meals and how to evaluate the patients' food  intake on their blood glucose level.;Food label reading, portion sizes and measuring food.    Physical activity and exercise   Role of exercise on diabetes management, blood pressure control and cardiac health.;Helped patient identify appropriate exercises in relation to his/her diabetes, diabetes complications and other health issue.    Medications  Reviewed patients medication for diabetes, action, purpose, timing of dose and side effects.    Monitoring  Purpose and frequency of SMBG.;Identified appropriate SMBG and/or A1C goals.    Psychosocial adjustment  Worked with patient to identify barriers to care and solutions;Role of stress on diabetes      Individualized Goals (developed by patient)   Nutrition  Follow meal plan discussed    Physical Activity  Exercise 3-5 times per week    Medications  take my medication as prescribed    Monitoring   test blood glucose pre and post meals as discussed      Post-Education Assessment   Patient understands the diabetes disease and treatment process.  Demonstrates understanding / competency    Patient understands incorporating nutritional management into lifestyle.  Demonstrates understanding / competency    Patient undertands incorporating physical activity into lifestyle.  Demonstrates understanding / competency    Patient understands using medications safely.  Demonstrates understanding / competency    Patient understands monitoring blood glucose, interpreting and using results  Demonstrates understanding / competency    Patient understands prevention, detection, and treatment of acute complications.  Demonstrates understanding / competency    Patient understands prevention, detection, and treatment of chronic complications.  Demonstrates understanding / competency    Patient understands how to develop strategies to address psychosocial issues.  Demonstrates understanding / competency    Patient understands how to develop strategies to promote  health/change behavior.  Demonstrates understanding / competency      Outcomes   Expected Outcomes  Demonstrated interest in learning. Expect positive outcomes    Future DMSE  4-6 wks    Program Status  Completed       Individualized Plan for Diabetes Self-Management Training:   Learning Objective:  Patient will have a greater understanding of diabetes self-management. Patient education plan is to attend individual and/or group sessions per assessed needs and concerns.   Plan:   Patient Instructions  Plan:  Aim for 2-3 Carb Choices per meal (30-45 grams)   Aim for 0-2 Carbs per snack if hungry  Include protein in moderation with your meals and snacks Consider reading food labels for Total Carbohydrate of foods Consider  increasing your activity level by  joining [employer] and finding out types of exercise classes appeal to you for 15-30 minutes daily as tolerated Consider checking BG at alternate times per day   Continue taking medication as directed by MD  Expected Outcomes:  Demonstrated interest in learning. Expect positive outcomes  Education material provided: A1C conversion sheet, Meal plan card and Carbohydrate counting sheet  If problems or questions, patient to contact team via:  Phone  Future DSME appointment: 4-6 wks per patient request

## 2017-11-14 NOTE — Patient Instructions (Signed)
Plan:  Aim for 2-3 Carb Choices per meal (30-45 grams)   Aim for 0-2 Carbs per snack if hungry  Include protein in moderation with your meals and snacks Consider reading food labels for Total Carbohydrate of foods Consider  increasing your activity level by joining [employer] and finding out types of exercise classes appeal to you for 15-30 minutes daily as tolerated Consider checking BG at alternate times per day   Continue taking medication as directed by MD

## 2017-12-19 ENCOUNTER — Ambulatory Visit: Payer: Medicare Other | Admitting: *Deleted

## 2018-01-28 ENCOUNTER — Other Ambulatory Visit (HOSPITAL_COMMUNITY): Payer: Self-pay | Admitting: Radiology

## 2018-01-28 ENCOUNTER — Emergency Department (HOSPITAL_COMMUNITY): Payer: Medicare Other

## 2018-01-28 ENCOUNTER — Encounter (HOSPITAL_COMMUNITY): Payer: Self-pay | Admitting: *Deleted

## 2018-01-28 ENCOUNTER — Emergency Department (HOSPITAL_COMMUNITY)
Admission: EM | Admit: 2018-01-28 | Discharge: 2018-01-28 | Disposition: A | Discharge status: Home / Self Care | Payer: Medicare Other | Attending: Emergency Medicine | Admitting: Emergency Medicine

## 2018-01-28 ENCOUNTER — Other Ambulatory Visit: Payer: Self-pay

## 2018-01-28 DIAGNOSIS — M48061 Spinal stenosis, lumbar region without neurogenic claudication: Secondary | ICD-10-CM | POA: Insufficient documentation

## 2018-01-28 DIAGNOSIS — Z79899 Other long term (current) drug therapy: Secondary | ICD-10-CM | POA: Diagnosis not present

## 2018-01-28 DIAGNOSIS — K654 Sclerosing mesenteritis: Secondary | ICD-10-CM | POA: Diagnosis not present

## 2018-01-28 DIAGNOSIS — M545 Low back pain, unspecified: Secondary | ICD-10-CM

## 2018-01-28 DIAGNOSIS — I10 Essential (primary) hypertension: Secondary | ICD-10-CM | POA: Insufficient documentation

## 2018-01-28 DIAGNOSIS — Z7984 Long term (current) use of oral hypoglycemic drugs: Secondary | ICD-10-CM | POA: Insufficient documentation

## 2018-01-28 DIAGNOSIS — E1140 Type 2 diabetes mellitus with diabetic neuropathy, unspecified: Secondary | ICD-10-CM | POA: Diagnosis not present

## 2018-01-28 LAB — URINALYSIS, ROUTINE W REFLEX MICROSCOPIC
BILIRUBIN URINE: NEGATIVE
Glucose, UA: NEGATIVE mg/dL
HGB URINE DIPSTICK: NEGATIVE
Ketones, ur: NEGATIVE mg/dL
Leukocytes, UA: NEGATIVE
Nitrite: NEGATIVE
Protein, ur: NEGATIVE mg/dL
Specific Gravity, Urine: 1.012 (ref 1.005–1.030)
pH: 6 (ref 5.0–8.0)

## 2018-01-28 LAB — COMPREHENSIVE METABOLIC PANEL
ALK PHOS: 59 U/L (ref 38–126)
ALT: 18 U/L (ref 0–44)
ANION GAP: 7 (ref 5–15)
AST: 23 U/L (ref 15–41)
Albumin: 3.9 g/dL (ref 3.5–5.0)
BUN: 16 mg/dL (ref 8–23)
CALCIUM: 9.1 mg/dL (ref 8.9–10.3)
CO2: 24 mmol/L (ref 22–32)
CREATININE: 0.79 mg/dL (ref 0.44–1.00)
Chloride: 110 mmol/L (ref 98–111)
GFR calc non Af Amer: >60 mL/min (ref >60)
Glucose, Bld: 126 mg/dL — ABNORMAL HIGH (ref 70–99)
Potassium: 4 mmol/L (ref 3.5–5.1)
SODIUM: 141 mmol/L (ref 135–145)
Total Bilirubin: 0.7 mg/dL (ref 0.3–1.2)
Total Protein: 7.1 g/dL (ref 6.5–8.1)

## 2018-01-28 LAB — CBC WITH DIFFERENTIAL/PLATELET
Basophils Absolute: 0 10*3/uL (ref 0.0–0.1)
Basophils Relative: 0 %
EOS ABS: 0.2 10*3/uL (ref 0.0–0.7)
EOS PCT: 2 %
HCT: 40.2 % (ref 36.0–46.0)
HEMOGLOBIN: 13.2 g/dL (ref 12.0–15.0)
LYMPHS ABS: 2.7 10*3/uL (ref 0.7–4.0)
LYMPHS PCT: 33 %
MCH: 28.6 pg (ref 26.0–34.0)
MCHC: 32.8 g/dL (ref 30.0–36.0)
MCV: 87 fL (ref 78.0–100.0)
MONOS PCT: 6 %
Monocytes Absolute: 0.5 10*3/uL (ref 0.1–1.0)
NEUTROS PCT: 59 %
Neutro Abs: 5 10*3/uL (ref 1.7–7.7)
Platelets: 268 10*3/uL (ref 150–400)
RBC: 4.62 MIL/uL (ref 3.87–5.11)
RDW: 14 % (ref 11.5–15.5)
WBC: 8.4 10*3/uL (ref 4.0–10.5)

## 2018-01-28 LAB — LIPASE, BLOOD: LIPASE: 39 U/L (ref 11–51)

## 2018-01-28 MED ORDER — HYDROCODONE-ACETAMINOPHEN 5-325 MG PO TABS: 1.0000 | ORAL_TABLET | Freq: Four times a day (QID) | ORAL | 0 refills | Status: DC | PRN

## 2018-01-28 MED ORDER — IOPAMIDOL (ISOVUE-300) INJECTION 61%
100.0000 mL | Freq: Once | INTRAVENOUS | Status: AC | PRN
  Administered 2018-01-28: 100 mL via INTRAVENOUS

## 2018-01-28 MED ORDER — ONDANSETRON HCL 4 MG/2ML IJ SOLN
4.0000 mg | Freq: Once | INTRAMUSCULAR | Status: AC
  Administered 2018-01-28: 4 mg via INTRAVENOUS
  Filled 2018-01-28: qty 2

## 2018-01-28 MED ORDER — LIDOCAINE 5 % EX PTCH
1.0000 | MEDICATED_PATCH | CUTANEOUS | Status: DC
  Administered 2018-01-28: 1 via TRANSDERMAL
  Filled 2018-01-28: qty 1

## 2018-01-28 MED ORDER — METHOCARBAMOL 500 MG PO TABS: 500.0000 mg | ORAL_TABLET | Freq: Two times a day (BID) | ORAL | 0 refills | Status: DC

## 2018-01-28 MED ORDER — SODIUM CHLORIDE 0.9 % IV BOLUS
500.0000 mL | Freq: Once | INTRAVENOUS | Status: AC
  Administered 2018-01-28: 500 mL via INTRAVENOUS

## 2018-01-28 MED ORDER — METHOCARBAMOL 500 MG PO TABS
500.0000 mg | ORAL_TABLET | Freq: Once | ORAL | Status: AC
  Administered 2018-01-28: 500 mg via ORAL
  Filled 2018-01-28: qty 1

## 2018-01-28 MED ORDER — MORPHINE SULFATE (PF) 4 MG/ML IV SOLN
4.0000 mg | Freq: Once | INTRAVENOUS | Status: AC
  Administered 2018-01-28: 4 mg via INTRAVENOUS
  Filled 2018-01-28: qty 1

## 2018-01-28 MED ORDER — IOPAMIDOL (ISOVUE-300) INJECTION 61%
INTRAVENOUS | Status: AC
  Filled 2018-01-28: qty 100

## 2018-01-28 NOTE — ED Provider Notes (Signed)
[facility] DEPT Provider Note   CSN: [id number] Arrival date & time: 01/28/18  4580     History   Chief Complaint Chief Complaint  Patient presents with  . Back Pain    HPI Jenny Wolf is a 72 y.o. female.  Jenny Wolf is a 72 y.o. Female with a history of hypertension, hyperlipidemia and diabetes, who presents to the emergency department for evaluation of right-sided low back pain and abdominal pain.  She reports pain started in her back about 2 days ago and it feels like a tight spasm, it radiates towards her rectum does not radiate down the leg.  She has had no loss of bowel or bladder control, no saddle anesthesia.  Patient also reports that tonight she is developed some pain on the right side of her abdomen and had difficulty getting up out of bed.  No fevers or chills, no nausea or vomiting.  No diarrhea, melena, hematochezia or constipation.  Patient denies any burning or discomfort with urination.  She is taking Aleve with minimal relief and has not tried anything else to treat her symptoms.  Reports history of similar back spasms in the past but has never had associated abdominal pain.  No chest pain, shortness of breath or cough.  No other aggravating or alleviating factors.     Past Medical History:  Diagnosis Date  . Carpal tunnel syndrome, left    mild  . Diabetes mellitus with neurological manifestations, controlled ([place])    diabetic with peripheral neuropathy  . Diabetic neuropathy ([place])   . Glaucoma   . Hyperlipidemia   . Hypertension   . Obesity   . Paresthesias    L and R hand     Patient Active Problem List   Diagnosis Date Noted  . Diabetes mellitus with neurological manifestations ([place]) 05/01/2014  . Essential hypertension 05/01/2014  . Hyperlipidemia 05/01/2014  . Diabetic neuropathy ([place]) 05/01/2014  . Vitamin D deficiency 05/01/2014    Past Surgical History:  Procedure Laterality Date  . BREAST  EXCISIONAL BIOPSY Right 2000     OB History   None      Home Medications    Prior to Admission medications   Medication Sig Start Date End Date Taking? Authorizing Provider  cyclobenzaprine (FLEXERIL) 10 MG tablet Take 1 tablet (10 mg total) by mouth 2 (two) times daily as needed for muscle spasms. 03/07/15  Yes Delsa Grana, PA-C  glipiZIDE (GLUCOTROL XL) 5 MG 24 hr tablet Take 5 mg by mouth daily with breakfast.   Yes [provider]  latanoprost (XALATAN) 0.005 % ophthalmic solution Place 1 drop into both eyes at bedtime.   Yes [provider]  lisinopril (PRINIVIL,ZESTRIL) 20 MG tablet Take 20 mg by mouth daily.   Yes [provider]  metFORMIN (GLUCOPHAGE) 1000 MG tablet Take 1,000 mg by mouth daily with breakfast.    Yes [provider]  naproxen (NAPROSYN) 500 MG tablet Take 1 tablet (500 mg total) by mouth 2 (two) times daily. Patient taking differently: Take 500 mg by mouth 2 (two) times daily as needed for moderate pain.  12/07/14  Yes Tanna Furry, MD  timolol (BETIMOL) 0.5 % ophthalmic solution Place 1 drop into both eyes 2 (two) times daily.   Yes [provider]  diclofenac sodium (VOLTAREN) 1 % GEL Apply 2 g topically 4 (four) times daily. Patient not taking: Reported on 01/28/2018 03/08/15   Lorayne Bender, PA-C  HYDROcodone-acetaminophen [facility]) [phone]  MG tablet Take 1 tablet by mouth every 6 (six) hours as needed. 01/28/18   Jacqlyn Larsen, PA-C  ibuprofen (ADVIL,MOTRIN) 800 MG tablet Take 1 tablet (800 mg total) by mouth 3 (three) times daily. Patient not taking: Reported on 01/28/2018 03/08/15   Joy, Helane Gunther, PA-C  methocarbamol (ROBAXIN) 500 MG tablet Take 1 tablet (500 mg total) by mouth 2 (two) times daily. 01/28/18   Jacqlyn Larsen, PA-C  tiZANidine (ZANAFLEX) 2 MG tablet Take 1 tablet (2 mg total) by mouth every 6 (six) hours as needed for muscle spasms. Patient not taking: Reported on 01/28/2018 03/08/15   Joy, Helane Gunther, PA-C    traMADol (ULTRAM) 50 MG tablet Take 1 tablet (50 mg total) by mouth every 6 (six) hours as needed. Patient not taking: Reported on 01/28/2018 03/07/15   Delsa Grana, PA-C    Family History Family History  Problem Relation Age of Onset  . Breast cancer Neg Hx     Social History Social History   Tobacco Use  . Smoking status: Never Smoker  Substance Use Topics  . Alcohol use: Yes    Alcohol/week: 1.0 standard drinks    Types: 1 Glasses of wine per week    Comment: socially  . Drug use: No     Allergies   Apple   Review of Systems Review of Systems  Constitutional: Negative for chills and fever.  HENT: Negative.   Respiratory: Negative for cough and shortness of breath.   Cardiovascular: Negative for chest pain.  Gastrointestinal: Positive for abdominal pain. Negative for blood in stool, constipation, diarrhea, nausea and vomiting.  Genitourinary: Negative for dysuria, flank pain, frequency, vaginal bleeding and vaginal discharge.  Musculoskeletal: Positive for back pain. Negative for arthralgias and myalgias.  Skin: Negative for color change and rash.  Neurological: Negative for dizziness, syncope, weakness, light-headedness and numbness.     Physical Exam Updated Vital Signs BP (!) 156/82   Pulse 77   Temp 98.7 F (37.1 C)   Resp 18   SpO2 98%   Physical Exam  Constitutional: She is oriented to person, place, and time. She appears well-developed and well-nourished. No distress.  HENT:  Head: Normocephalic and atraumatic.  Mouth/Throat: Oropharynx is clear and moist.  Eyes: Right eye exhibits no discharge. Left eye exhibits no discharge.  Neck: Neck supple.  Cardiovascular: Normal rate, regular rhythm, normal heart sounds and intact distal pulses.  Pulmonary/Chest: Effort normal and breath sounds normal. No respiratory distress.  Respirations equal and unlabored, patient able to speak in full sentences, lungs clear to auscultation bilaterally  Abdominal:  Soft. Bowel sounds are normal. She exhibits no distension and no mass. There is tenderness. There is guarding. There is no rebound.  Abdomen soft, nondistended, bowel sounds present throughout, patient has tenderness along the right side of the abdomen with some mild guarding, no rebound tenderness or rigidity.  No CVA tenderness.  Musculoskeletal:  There is tenderness to palpation over the midline lumbar spine extending across the right lower back, no palpable deformity or overlying skin changes, pain worsened with movement in bilateral lower extremities  Neurological: She is alert and oriented to person, place, and time. Coordination normal.  Patient is alert and oriented, clear speech, following commands. Moving all extremities without difficulty. 5/5 strength in bilateral lower extremities and proximal and distal muscle groups and with dorsi and plantar flexion. Sensation intact in bilateral lower extremities.  Skin: Skin is warm and dry. Capillary refill takes less than 2 seconds.  She is not diaphoretic.  Psychiatric: She has a normal mood and affect. Her behavior is normal.  Nursing note and vitals reviewed.    ED Treatments / Results  Labs (all labs ordered are listed, but only abnormal results are displayed) Labs Reviewed  COMPREHENSIVE METABOLIC PANEL - Abnormal; Notable for the following components:      Result Value   Glucose, Bld 126 (*)    All other components within normal limits  URINALYSIS, ROUTINE W REFLEX MICROSCOPIC  CBC WITH DIFFERENTIAL/PLATELET  LIPASE, BLOOD    EKG None  Radiology Ct Abdomen Pelvis W Contrast  Result Date: 01/28/2018 CLINICAL DATA:  Right abdominal and flank pain EXAM: CT ABDOMEN AND PELVIS WITH CONTRAST TECHNIQUE: Multidetector CT imaging of the abdomen and pelvis was performed using the standard protocol following bolus administration of intravenous contrast. CONTRAST:  100 mL ISOVUE-300 IOPAMIDOL (ISOVUE-300) INJECTION 61% COMPARISON:   March 08, 2015 FINDINGS: Lower chest: There is a small area of infiltrate in the posterior right base. Lung bases otherwise are clear. There is a small hiatal hernia. Hepatobiliary: There is evident hepatic steatosis. No focal liver lesions are apparent. Gallbladder wall is not appreciably thickened. There is no biliary duct dilatation. Pancreas: No evident pancreatic mass or inflammatory focus. Spleen: No splenic lesions are appreciable. Adrenals/Urinary Tract: Adrenals bilaterally appear normal. There is no evident renal mass or hydronephrosis on either side there is no evident renal or ureteral calculus on either side. Urinary bladder is midline with wall thickness within normal limits. Stomach/Bowel: There is moderate stool in the colon. There is no appreciable bowel wall or mesenteric thickening. There is no appreciable bowel obstruction. There is no free air or portal venous air. Vascular/Lymphatic: There is mild aortic atherosclerosis. No evident aneurysm. Major mesenteric arterial vessels appear patent. There is multiple small lymph nodes in the mid abdomen with occasional lymph nodes borderline prominent in this area. Largest of these lymph nodes has a short axis diameter of 1.1 cm. No lymph node prominence elsewhere in the abdomen or pelvis. Reproductive: Uterus is absent.  There is no evident pelvic mass. Other: Appendix appears normal. No evident abscess or ascites in the abdomen or pelvis. There is a minimal umbilical hernia. A loop of small bowel extends into this hernia without apparent bowel compromise. There is soft tissue stranding in the mesentery of the mid abdomen with multiple subcentimeter lymph nodes in this area. This area mesenteric sclerosis extends over a distance of 8.3 cm from right to left dimension, 5.4 cm from anterior to posterior dimension, and 11.6 cm from superior to inferior dimension. No disruption of vascular architecture in this area. Musculoskeletal: There is degenerative  change in the lumbar spine. There is spinal stenosis at L3-4 due to bony hypertrophy and disc protrusion. There is lateral canal stenosis at L4-5 due to bony hypertrophy. No evident blastic or lytic bone lesions. No intramuscular lesions are evident. IMPRESSION: 1. 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 There is no appreciable bowel extending into this area. Multiple small lymph nodes are present throughout this area. Occasional lymph nodes which are borderline prominent are noted in this area. Largest lymph node in this area has a short axis diameter of 1.1 cm. This area of apparent sclerosing mesenteritis measures 11.6 x 8.3 x 5.4 cm. No disruption of vascular architecture in this area. No associated internal hernia evident. Given this finding with multiple lymph nodes in this area, a follow-up study in approximately 6 months to  assess for stability would be warranted. Note that there is no lymph node prominence elsewhere in the abdomen or pelvis. 2. Rather minimal ventral hernia. There is an apparent loop of small bowel extending into this area without bowel compromise. 3.  Small area of apparent pneumonia posterior right lung base. 4. No evident bowel obstruction. No abscess in the abdomen pelvis. Appendix normal. 5.  Evident spinal stenosis at L3-4 and L4-5 as noted. 6.  Small hiatal hernia. 7.  Aortic atherosclerosis. 8.  Uterus absent. Aortic Atherosclerosis (ICD10-I70.0). Electronically Signed   By: Lowella Grip III M.D.   On: 01/28/2018 08:26   Ct L-spine No Charge  Result Date: 01/28/2018 CLINICAL DATA:  RIGHT lower back pain with spasms. EXAM: CT LUMBAR SPINE WITHOUT CONTRAST TECHNIQUE: Multidetector CT imaging of the lumbar spine was performed without intravenous contrast administration. Multiplanar CT image reconstructions were also generated. COMPARISON:  CT abdomen and pelvis was performed earlier today. This study was generated from the axial  data set related to that examination. FINDINGS: Segmentation: Standard. Alignment: Anatomic. Vertebrae: Endplate reactive changes at L4-5 reflect disc space narrowing. Paraspinal and other soft tissues: Reported separately. Disc levels: L1-L2: The disc appears grossly normal. There is facet hypertrophy with calcified ligamentum flavum, mildly narrowing the spinal canal. No definite impingement. L2-L3: The disc appears normal. There is facet arthropathy with markedly hypertrophied and calcified ligamentum flavum. These contribute to mild to moderate stenosis, with suspected LEFT L2 and LEFT L3 neural impingement. L3-L4: Slight disc space narrowing, anterior osseous spurring. Bilobed disc protrusions extend into the foramina, greater on the RIGHT. Facet arthropathy is accompanied by ligamentum flavum hypertrophy and calcification. Moderate to severe stenosis. BILATERAL L4 and L3 neural impingement. L4-L5: Advanced disc space narrowing. Osseous spurring extends posteriorly into the spinal canal and foramina. There is advanced facet arthropathy, along with ligamentum flavum hypertrophy which is calcified. There is moderate to severe stenosis. LEFT greater than RIGHT L5 neural impingement due to subarticular zone narrowing. RIGHT greater than LEFT L4 neural impingement due to foraminal zone narrowing. L5-S1: The disc bulges mildly, and there is osseous spurring extending to the foramina. There is facet hypertrophy with ligamentum flavum calcification, RIGHT greater than LEFT. Mild to moderate central canal stenosis is observed, with RIGHT greater than LEFT L5 and S1 neural impingement likely. IMPRESSION: Multilevel spondylosis as described. Potentially symptomatic spinal stenosis, subarticular zone, and foraminal zone narrowing at L2-3, L3-4, L4-5, and L5-S1. These changes appear most severe on the RIGHT at L5-S1, and L4-5. Changes of spinal stenosis or most pronounced at L3-4 and L4-5. No acute compression fracture or  traumatic subluxation. Electronically Signed   By: Staci Righter M.D.   On: 01/28/2018 09:21    Procedures Procedures (including critical care time)  Medications Ordered in ED Medications  sodium chloride 0.9 % bolus 500 mL (0 mLs Intravenous Stopped 01/28/18 0723)  ondansetron (ZOFRAN) injection 4 mg (4 mg Intravenous Given 01/28/18 0638)  morphine 4 MG/ML injection 4 mg (4 mg Intravenous Given 01/28/18 3662)  methocarbamol (ROBAXIN) tablet 500 mg (500 mg Oral Given 01/28/18 0631)  iopamidol (ISOVUE-300) 61 % injection 100 mL (0 mLs Intravenous Hold 01/28/18 0739)    Initial Impression / Assessment and Plan / ED Course  I have reviewed the triage vital signs and the nursing notes.  Pertinent labs & imaging results that were available during my care of the patient were reviewed by me and considered in my medical decision making (see chart for details).  Patient presents for  evaluation of right-sided low back pain that has been worsening over the past 2 days, history of the same in the past but has not been this severe before.  She reports today she is noticed that the pain was also present on the right side of the abdomen so presented for evaluation.  No fevers, vomiting, diarrhea, constipation or unexpected weight loss.  On arrival only hypertensive but vitals otherwise stable.  Patient appears uncomfortable but is in no acute distress.  Tenderness over the right lower back made worse with movement.  Patient also has tenderness with guarding over the right side of the abdomen.  Given worsening back pain with now abdominal pain will get abdominal labs and CT abdomen pelvis with recon of the lumbar spine.  Will give 500 cc fluid bolus, Zofran, morphine and Robaxin for symptomatic management.  Labs extremely reassuring, no leukocytosis, normal hemoglobin, mild hyperglycemia 126 but no other acute electrolyte derangements, normal renal and liver function and normal lipase.  Urinalysis without any signs  of infection.  CT shows area of sclerosing mesenteritis of 11 x 8 x 3 cm that involves multiple lymph nodes, no evidence of vascular compromise, no evidence of bowel obstruction.  No evidence of lymph node enhancement elsewhere in the abdomen or pelvis.  No other acute intra-abdominal findings.  There is a small area suggestive of pneumonia in the right lower lobe but patient has absolutely no pneumonia symptoms, no cough, shortness of breath, chest discomfort and no leukocytosis and I favor this being more likely atelectasis.  Lumbar recon shows spinal stenosis L3-4 and L4-5 likely contributing to patient's back pain.    Sclerosing mesenteritis discussed with Dr. Redmond Pulling with general surgery who reports that this is typically medically managed and surgery is only involved if a biopsy is needed, and since this patient appears to be completely asymptomatic with that this can likely be followed up with her primary care doctor with repeat CT scans as needed.  On reevaluation patient reports pain is improving, abdominal tenderness has resolved, she still has some mild low back pain, will provide lidocaine patch.  I think the majority of patient's symptoms are due to low back pain.  Will continue to treat this with muscle relaxers, pain medication and lidocaine patches and have patient follow-up with spine specialist.  Patient is neurovascularly intact.  Discussed appropriate return precautions patient will follow-up with her primary care doctor and spine specialist.  At this time patient is stable for discharge home.  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

## 2018-01-28 NOTE — Discharge Instructions (Signed)
Your lab work is very reassuring today.  Your CT scan shows some spinal stenosis in your lumbar spine which is I think what is likely causing your back pain.  I would like for you to use Robaxin, over-the-counter salon pas to cane patches and Norco as needed for breakthrough pain, do not drive after taking Norco it can cause drowsiness.  I would like free to follow-up with your primary care doctor as well as [place] neurosurgery and spine specialist for further evaluation and management of this back pain.  If you develop weakness or numbness in your legs loss of control of your bowels or bladder or any other new or concerning symptoms return for reevaluation.  Your CT showed sclerosing mesenteritis which is an inflammation of the mesentery and lymph nodes in your abdomen this is likely what was causing the tenderness you had on your abdominal exam today.  Your labs are reassuring and you do not appear to be having any other acute symptoms with this so I would like free to follow-up with Dr. Sabra Heck regarding this, when you are not experiencing symptoms from this it is typically managed with watchful waiting, and repeat CT scans as needed.  Return for fevers, worsening abdominal pain, nausea or vomiting, inability to pass gas or have a bowel movement, unexpected weight loss or any other new or concerning symptoms.

## 2018-01-28 NOTE — ED Notes (Signed)
Patient transported to CT 

## 2018-01-28 NOTE — ED Notes (Signed)
ED Provider at bedside. 

## 2018-01-28 NOTE — ED Triage Notes (Signed)
Pt reports right lower back pain spasms for about 2 days, worse tonight. She says it feels like the pain radiates to her rectum area. Last took naproxyn PTA. No numbness or tingling or urinary symptoms.

## 2018-02-15 ENCOUNTER — Other Ambulatory Visit: Payer: Self-pay | Admitting: Family Medicine

## 2018-02-15 DIAGNOSIS — Z1231 Encounter for screening mammogram for malignant neoplasm of breast: Secondary | ICD-10-CM

## 2018-03-06 ENCOUNTER — Ambulatory Visit
Admission: RE | Admit: 2018-03-06 | Discharge: 2018-03-06 | Disposition: A | Discharge status: Home / Self Care | Payer: Medicare Other | Source: Ambulatory Visit | Attending: Family Medicine | Admitting: Family Medicine

## 2018-03-06 DIAGNOSIS — Z1231 Encounter for screening mammogram for malignant neoplasm of breast: Secondary | ICD-10-CM

## 2018-03-26 ENCOUNTER — Ambulatory Visit: Payer: Medicare Other

## 2018-12-04 ENCOUNTER — Emergency Department (HOSPITAL_COMMUNITY): Payer: Medicare Other

## 2018-12-04 ENCOUNTER — Emergency Department (HOSPITAL_COMMUNITY)
Admission: EM | Admit: 2018-12-04 | Discharge: 2018-12-04 | Disposition: A | Discharge status: Home / Self Care | Payer: Medicare Other | Attending: Emergency Medicine | Admitting: Emergency Medicine

## 2018-12-04 ENCOUNTER — Other Ambulatory Visit: Payer: Self-pay

## 2018-12-04 ENCOUNTER — Encounter (HOSPITAL_COMMUNITY): Payer: Self-pay

## 2018-12-04 DIAGNOSIS — M542 Cervicalgia: Secondary | ICD-10-CM | POA: Insufficient documentation

## 2018-12-04 DIAGNOSIS — Z7984 Long term (current) use of oral hypoglycemic drugs: Secondary | ICD-10-CM | POA: Diagnosis not present

## 2018-12-04 DIAGNOSIS — E119 Type 2 diabetes mellitus without complications: Secondary | ICD-10-CM | POA: Diagnosis not present

## 2018-12-04 DIAGNOSIS — Z79899 Other long term (current) drug therapy: Secondary | ICD-10-CM | POA: Insufficient documentation

## 2018-12-04 DIAGNOSIS — M62838 Other muscle spasm: Secondary | ICD-10-CM | POA: Insufficient documentation

## 2018-12-04 DIAGNOSIS — I10 Essential (primary) hypertension: Secondary | ICD-10-CM | POA: Insufficient documentation

## 2018-12-04 DIAGNOSIS — M25512 Pain in left shoulder: Secondary | ICD-10-CM | POA: Diagnosis present

## 2018-12-04 MED ORDER — NAPROXEN 375 MG PO TABS: 375.0000 mg | ORAL_TABLET | Freq: Two times a day (BID) | ORAL | 0 refills | Status: DC

## 2018-12-04 MED ORDER — TIZANIDINE HCL 2 MG PO TABS: 2.0000 mg | ORAL_TABLET | Freq: Three times a day (TID) | ORAL | 0 refills | Status: AC | PRN

## 2018-12-04 NOTE — ED Provider Notes (Signed)
[facility] DEPT Provider Note   CSN: [id number] Arrival date & time: 12/04/18  1132     History   Chief Complaint Chief Complaint  Patient presents with  . Shoulder Pain  . Neck Pain    HPI Jenny Wolf is a 73 y.o. female.     HPI 73 year old female comes in with chief complaint of neck pain and shoulder pain. Patient reports that she started having pain last night.  The pain is located in the left lower part of the neck and radiates towards the shoulder blade area.  Her pain is worse when she turns left.  She has no significant discomfort with turning the head to the right side.  There is no headache, numbness, tingling, dizziness, vision change.  Patient does not have any history of similar symptoms in the past.   Past Medical History:  Diagnosis Date  . Carpal tunnel syndrome, left    mild  . 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 Diabetic neuropathy ([place])   . Glaucoma   . Hyperlipidemia   . Hypertension   . Obesity   . Paresthesias    L and R hand     Patient Active Problem List   Diagnosis Date Noted  . Diabetes mellitus with neurological manifestations ([place]) 05/01/2014  . Essential hypertension 05/01/2014  . Hyperlipidemia 05/01/2014  . Diabetic neuropathy ([place]) 05/01/2014  . Vitamin D deficiency 05/01/2014    Past Surgical History:  Procedure Laterality Date  . BREAST EXCISIONAL BIOPSY Right 2000     OB History   No obstetric history on file.      Home Medications    Prior to Admission medications   Medication Sig Start Date End Date Taking? Authorizing Provider  diclofenac sodium (VOLTAREN) 1 % GEL Apply 2 g topically 4 (four) times daily. Patient not taking: Reported on 01/28/2018 03/08/15   Joy, Shawn C, PA-C  glipiZIDE (GLUCOTROL XL) 5 MG 24 hr tablet Take 5 mg by mouth daily with breakfast.    [provider]   HYDROcodone-acetaminophen (NORCO) 5-325 MG tablet Take 1 tablet by mouth every 6 (six) hours as needed. 01/28/18   Jacqlyn Larsen, PA-C  ibuprofen (ADVIL,MOTRIN) 800 MG tablet Take 1 tablet (800 mg total) by mouth 3 (three) times daily. Patient not taking: Reported on 01/28/2018 03/08/15   Joy, Raquel Sarna C, PA-C  latanoprost (XALATAN) 0.005 % ophthalmic solution Place 1 drop into both eyes at bedtime.    [provider]  lisinopril (PRINIVIL,ZESTRIL) 20 MG tablet Take 20 mg by mouth daily.    [provider]  metFORMIN (GLUCOPHAGE) 1000 MG tablet Take 1,000 mg by mouth daily with breakfast.     [provider]  naproxen (NAPROSYN) 375 MG tablet Take 1 tablet (375 mg total) by mouth 2 (two) times daily with a meal. 12/04/18   Shaneequa Bahner, MD  timolol (BETIMOL) 0.5 % ophthalmic solution Place 1 drop into both eyes 2 (two) times daily.    [provider]  tiZANidine (ZANAFLEX) 2 MG tablet Take 1 tablet (2 mg total) by mouth every 8 (eight) hours as needed for muscle spasms. 12/04/18   Varney Biles, MD  traMADol (ULTRAM) 50 MG tablet Take 1 tablet (50 mg total) by mouth every 6 (six) hours as needed. Patient not taking: Reported on 01/28/2018 03/07/15   Delsa Grana, PA-C    Family History Family History  Problem Relation Age of  Onset  . Breast cancer Neg Hx     Social History Social History   Tobacco Use  . Smoking status: Never Smoker  Substance Use Topics  . Alcohol use: Yes    Alcohol/week: 1.0 standard drinks    Types: 1 Glasses of wine per week    Comment: socially  . Drug use: No     Allergies   Apple   Review of Systems Review of Systems  Constitutional: Positive for activity change.  Musculoskeletal: Positive for neck pain and neck stiffness.     Physical Exam Updated Vital Signs BP (!) 142/82   Pulse 78   Temp 98.6 F (37 C)   Resp 14   Ht 5\' 6"  (1.676 m)   Wt 87.1 kg   SpO2 99%   BMI 30.99 kg/m   Physical Exam Vitals  signs and nursing note reviewed.  Constitutional:      Appearance: She is well-developed.  HENT:     Head: Normocephalic and atraumatic.  Neck:     Musculoskeletal: Normal range of motion.  Cardiovascular:     Rate and Rhythm: Normal rate.  Pulmonary:     Effort: Pulmonary effort is normal.  Abdominal:     General: Bowel sounds are normal.  Musculoskeletal:     Comments: Patient has significant tightness over the left anterior shoulder, shoulder blade region.  Symptoms are reproduced with palpation in that area.   Skin:    General: Skin is warm and dry.  Neurological:     Mental Status: She is alert and oriented to person, place, and time.     Cranial Nerves: No cranial nerve deficit.     Sensory: No sensory deficit.      ED Treatments / Results  Labs (all labs ordered are listed, but only abnormal results are displayed) Labs Reviewed - No data to display  EKG None  Radiology Dg Shoulder Left  Result Date: 12/04/2018 CLINICAL DATA:  73 year old with left shoulder pain with movement. EXAM: LEFT SHOULDER - 2+ VIEW COMPARISON:  None. FINDINGS: Left shoulder is located without a fracture. Mild sclerosis and degenerative changes along the inferior left glenoid. Normal alignment at the left [facility] joint. Visualized left ribs are intact. IMPRESSION: No acute abnormality to the left shoulder. Electronically Signed   By: Markus Daft M.D.   On: 12/04/2018 12:41    Procedures Procedures (including critical care time)  Medications Ordered in ED Medications - No data to display   Initial Impression / Assessment and Plan / ED Course  I have reviewed the triage vital signs and the nursing notes.  Pertinent labs & imaging results that were available during my care of the patient were reviewed by me and considered in my medical decision making (see chart for details).        73 year old comes in a chief complaint of neck pain.  She is having muscle spasms based on her history and  exam.  No red flags suggesting vertebral artery dissection and patient has no neurologic symptoms.  We will send her home with Zanaflex.   Final Clinical Impressions(s) / ED Diagnoses   Final diagnoses:  Muscle spasm of left shoulder  Neck muscle spasm    ED Discharge Orders         Ordered    tiZANidine (ZANAFLEX) 2 MG tablet  Every 8 hours PRN     12/04/18 1323    naproxen (NAPROSYN) 375 MG tablet  2 times daily,   Status:  Discontinued     12/04/18 1323    naproxen (NAPROSYN) 375 MG tablet  2 times daily with meals     12/04/18 [place], Kierra Jezewski, MD 12/04/18 1427

## 2018-12-04 NOTE — Discharge Instructions (Addendum)
Please take the medications prescribed and perform the stretching exercises we discussed.  You have significant spasming over the neck and left upper back. See your primary care doctor in 1 week, if the symptoms persist.

## 2018-12-04 NOTE — ED Triage Notes (Signed)
Pt states left sided neck and shoulder pain since last night. Pt stated it was difficult to get out of bed without assistance. Pain is worse with movement.

## 2019-03-21 ENCOUNTER — Other Ambulatory Visit: Payer: Self-pay

## 2019-03-21 DIAGNOSIS — Z20822 Contact with and (suspected) exposure to covid-19: Secondary | ICD-10-CM

## 2019-03-25 LAB — NOVEL CORONAVIRUS, NAA: SARS-CoV-2, NAA: NOT DETECTED

## 2019-04-11 ENCOUNTER — Other Ambulatory Visit: Payer: Self-pay

## 2019-04-11 DIAGNOSIS — Z20822 Contact with and (suspected) exposure to covid-19: Secondary | ICD-10-CM

## 2019-04-13 LAB — NOVEL CORONAVIRUS, NAA: SARS-CoV-2, NAA: NOT DETECTED

## 2020-05-06 ENCOUNTER — Other Ambulatory Visit: Payer: Self-pay | Admitting: Family Medicine

## 2020-05-06 DIAGNOSIS — Z1231 Encounter for screening mammogram for malignant neoplasm of breast: Secondary | ICD-10-CM

## 2020-06-15 ENCOUNTER — Other Ambulatory Visit: Payer: Self-pay

## 2020-06-15 ENCOUNTER — Ambulatory Visit
Admission: RE | Admit: 2020-06-15 | Discharge: 2020-06-15 | Disposition: A | Discharge status: Home / Self Care | Payer: Medicare Other | Source: Ambulatory Visit | Attending: Family Medicine | Admitting: Family Medicine

## 2020-06-15 DIAGNOSIS — Z1231 Encounter for screening mammogram for malignant neoplasm of breast: Secondary | ICD-10-CM | POA: Diagnosis not present

## 2020-06-19 DIAGNOSIS — E7800 Pure hypercholesterolemia, unspecified: Secondary | ICD-10-CM | POA: Diagnosis not present

## 2020-06-19 DIAGNOSIS — E1140 Type 2 diabetes mellitus with diabetic neuropathy, unspecified: Secondary | ICD-10-CM | POA: Diagnosis not present

## 2020-06-19 DIAGNOSIS — I10 Essential (primary) hypertension: Secondary | ICD-10-CM | POA: Diagnosis not present

## 2020-06-19 DIAGNOSIS — E1165 Type 2 diabetes mellitus with hyperglycemia: Secondary | ICD-10-CM | POA: Diagnosis not present

## 2020-07-08 DIAGNOSIS — H52223 Regular astigmatism, bilateral: Secondary | ICD-10-CM | POA: Diagnosis not present

## 2020-07-08 DIAGNOSIS — H35033 Hypertensive retinopathy, bilateral: Secondary | ICD-10-CM | POA: Diagnosis not present

## 2020-07-08 DIAGNOSIS — E119 Type 2 diabetes mellitus without complications: Secondary | ICD-10-CM | POA: Diagnosis not present

## 2020-07-08 DIAGNOSIS — H524 Presbyopia: Secondary | ICD-10-CM | POA: Diagnosis not present

## 2020-07-08 DIAGNOSIS — H401122 Primary open-angle glaucoma, left eye, moderate stage: Secondary | ICD-10-CM | POA: Diagnosis not present

## 2020-07-08 DIAGNOSIS — H5203 Hypermetropia, bilateral: Secondary | ICD-10-CM | POA: Diagnosis not present

## 2020-07-08 DIAGNOSIS — H401113 Primary open-angle glaucoma, right eye, severe stage: Secondary | ICD-10-CM | POA: Diagnosis not present

## 2020-07-08 DIAGNOSIS — H53431 Sector or arcuate defects, right eye: Secondary | ICD-10-CM | POA: Diagnosis not present

## 2020-07-08 DIAGNOSIS — Z0100 Encounter for examination of eyes and vision without abnormal findings: Secondary | ICD-10-CM | POA: Diagnosis not present

## 2020-07-17 DIAGNOSIS — E1140 Type 2 diabetes mellitus with diabetic neuropathy, unspecified: Secondary | ICD-10-CM | POA: Diagnosis not present

## 2020-07-17 DIAGNOSIS — E7800 Pure hypercholesterolemia, unspecified: Secondary | ICD-10-CM | POA: Diagnosis not present

## 2020-07-17 DIAGNOSIS — I10 Essential (primary) hypertension: Secondary | ICD-10-CM | POA: Diagnosis not present

## 2020-07-22 DIAGNOSIS — M1711 Unilateral primary osteoarthritis, right knee: Secondary | ICD-10-CM | POA: Diagnosis not present

## 2020-08-05 DIAGNOSIS — E1140 Type 2 diabetes mellitus with diabetic neuropathy, unspecified: Secondary | ICD-10-CM | POA: Diagnosis not present

## 2020-08-05 DIAGNOSIS — Z7984 Long term (current) use of oral hypoglycemic drugs: Secondary | ICD-10-CM | POA: Diagnosis not present

## 2020-08-05 DIAGNOSIS — E7800 Pure hypercholesterolemia, unspecified: Secondary | ICD-10-CM | POA: Diagnosis not present

## 2020-08-05 DIAGNOSIS — I10 Essential (primary) hypertension: Secondary | ICD-10-CM | POA: Diagnosis not present

## 2020-08-17 DIAGNOSIS — I10 Essential (primary) hypertension: Secondary | ICD-10-CM | POA: Diagnosis not present

## 2020-08-17 DIAGNOSIS — E1140 Type 2 diabetes mellitus with diabetic neuropathy, unspecified: Secondary | ICD-10-CM | POA: Diagnosis not present

## 2020-08-17 DIAGNOSIS — E7800 Pure hypercholesterolemia, unspecified: Secondary | ICD-10-CM | POA: Diagnosis not present

## 2020-09-01 DIAGNOSIS — Z20822 Contact with and (suspected) exposure to covid-19: Secondary | ICD-10-CM | POA: Diagnosis not present

## 2020-09-10 DIAGNOSIS — E1140 Type 2 diabetes mellitus with diabetic neuropathy, unspecified: Secondary | ICD-10-CM | POA: Diagnosis not present

## 2020-09-10 DIAGNOSIS — I10 Essential (primary) hypertension: Secondary | ICD-10-CM | POA: Diagnosis not present

## 2020-09-10 DIAGNOSIS — E7800 Pure hypercholesterolemia, unspecified: Secondary | ICD-10-CM | POA: Diagnosis not present

## 2020-10-19 DIAGNOSIS — R059 Cough, unspecified: Secondary | ICD-10-CM | POA: Diagnosis not present

## 2020-10-19 DIAGNOSIS — J00 Acute nasopharyngitis [common cold]: Secondary | ICD-10-CM | POA: Diagnosis not present

## 2020-10-19 DIAGNOSIS — Z20822 Contact with and (suspected) exposure to covid-19: Secondary | ICD-10-CM | POA: Diagnosis not present

## 2020-10-19 DIAGNOSIS — Z03818 Encounter for observation for suspected exposure to other biological agents ruled out: Secondary | ICD-10-CM | POA: Diagnosis not present

## 2020-10-26 DIAGNOSIS — E1140 Type 2 diabetes mellitus with diabetic neuropathy, unspecified: Secondary | ICD-10-CM | POA: Diagnosis not present

## 2020-10-26 DIAGNOSIS — E7800 Pure hypercholesterolemia, unspecified: Secondary | ICD-10-CM | POA: Diagnosis not present

## 2020-10-26 DIAGNOSIS — I10 Essential (primary) hypertension: Secondary | ICD-10-CM | POA: Diagnosis not present

## 2020-11-17 DIAGNOSIS — E119 Type 2 diabetes mellitus without complications: Secondary | ICD-10-CM | POA: Diagnosis not present

## 2020-12-14 DIAGNOSIS — I10 Essential (primary) hypertension: Secondary | ICD-10-CM | POA: Diagnosis not present

## 2020-12-14 DIAGNOSIS — E7800 Pure hypercholesterolemia, unspecified: Secondary | ICD-10-CM | POA: Diagnosis not present

## 2020-12-14 DIAGNOSIS — E1140 Type 2 diabetes mellitus with diabetic neuropathy, unspecified: Secondary | ICD-10-CM | POA: Diagnosis not present

## 2020-12-16 DIAGNOSIS — E119 Type 2 diabetes mellitus without complications: Secondary | ICD-10-CM | POA: Diagnosis not present

## 2021-01-13 DIAGNOSIS — E7800 Pure hypercholesterolemia, unspecified: Secondary | ICD-10-CM | POA: Diagnosis not present

## 2021-01-13 DIAGNOSIS — I10 Essential (primary) hypertension: Secondary | ICD-10-CM | POA: Diagnosis not present

## 2021-01-13 DIAGNOSIS — Z23 Encounter for immunization: Secondary | ICD-10-CM | POA: Diagnosis not present

## 2021-01-13 DIAGNOSIS — E1140 Type 2 diabetes mellitus with diabetic neuropathy, unspecified: Secondary | ICD-10-CM | POA: Diagnosis not present

## 2021-01-15 DIAGNOSIS — I10 Essential (primary) hypertension: Secondary | ICD-10-CM | POA: Diagnosis not present

## 2021-01-15 DIAGNOSIS — E1140 Type 2 diabetes mellitus with diabetic neuropathy, unspecified: Secondary | ICD-10-CM | POA: Diagnosis not present

## 2021-01-15 DIAGNOSIS — E7800 Pure hypercholesterolemia, unspecified: Secondary | ICD-10-CM | POA: Diagnosis not present

## 2021-02-11 DIAGNOSIS — E119 Type 2 diabetes mellitus without complications: Secondary | ICD-10-CM | POA: Diagnosis not present

## 2021-02-22 DIAGNOSIS — I10 Essential (primary) hypertension: Secondary | ICD-10-CM | POA: Diagnosis not present

## 2021-02-22 DIAGNOSIS — E7800 Pure hypercholesterolemia, unspecified: Secondary | ICD-10-CM | POA: Diagnosis not present

## 2021-02-22 DIAGNOSIS — E1140 Type 2 diabetes mellitus with diabetic neuropathy, unspecified: Secondary | ICD-10-CM | POA: Diagnosis not present

## 2021-03-22 DIAGNOSIS — E7800 Pure hypercholesterolemia, unspecified: Secondary | ICD-10-CM | POA: Diagnosis not present

## 2021-03-22 DIAGNOSIS — E1140 Type 2 diabetes mellitus with diabetic neuropathy, unspecified: Secondary | ICD-10-CM | POA: Diagnosis not present

## 2021-03-22 DIAGNOSIS — I10 Essential (primary) hypertension: Secondary | ICD-10-CM | POA: Diagnosis not present

## 2021-05-11 DIAGNOSIS — Z20822 Contact with and (suspected) exposure to covid-19: Secondary | ICD-10-CM | POA: Diagnosis not present

## 2021-05-12 DIAGNOSIS — E119 Type 2 diabetes mellitus without complications: Secondary | ICD-10-CM | POA: Diagnosis not present

## 2021-05-25 DIAGNOSIS — E1140 Type 2 diabetes mellitus with diabetic neuropathy, unspecified: Secondary | ICD-10-CM | POA: Diagnosis not present

## 2021-05-25 DIAGNOSIS — I10 Essential (primary) hypertension: Secondary | ICD-10-CM | POA: Diagnosis not present

## 2021-05-25 DIAGNOSIS — E7800 Pure hypercholesterolemia, unspecified: Secondary | ICD-10-CM | POA: Diagnosis not present

## 2021-05-25 DIAGNOSIS — E1143 Type 2 diabetes mellitus with diabetic autonomic (poly)neuropathy: Secondary | ICD-10-CM | POA: Diagnosis not present

## 2021-05-25 DIAGNOSIS — I700 Atherosclerosis of aorta: Secondary | ICD-10-CM | POA: Diagnosis not present

## 2021-05-25 DIAGNOSIS — E669 Obesity, unspecified: Secondary | ICD-10-CM | POA: Diagnosis not present

## 2021-06-14 DIAGNOSIS — D123 Benign neoplasm of transverse colon: Secondary | ICD-10-CM | POA: Diagnosis not present

## 2021-06-14 DIAGNOSIS — K5730 Diverticulosis of large intestine without perforation or abscess without bleeding: Secondary | ICD-10-CM | POA: Diagnosis not present

## 2021-06-14 DIAGNOSIS — Z86010 Personal history of colonic polyps: Secondary | ICD-10-CM | POA: Diagnosis not present

## 2021-06-16 DIAGNOSIS — D123 Benign neoplasm of transverse colon: Secondary | ICD-10-CM | POA: Diagnosis not present

## 2021-07-29 DIAGNOSIS — E7800 Pure hypercholesterolemia, unspecified: Secondary | ICD-10-CM | POA: Diagnosis not present

## 2021-07-29 DIAGNOSIS — E1143 Type 2 diabetes mellitus with diabetic autonomic (poly)neuropathy: Secondary | ICD-10-CM | POA: Diagnosis not present

## 2021-07-29 DIAGNOSIS — I10 Essential (primary) hypertension: Secondary | ICD-10-CM | POA: Diagnosis not present

## 2021-08-27 DIAGNOSIS — E119 Type 2 diabetes mellitus without complications: Secondary | ICD-10-CM | POA: Diagnosis not present

## 2021-09-06 ENCOUNTER — Other Ambulatory Visit: Payer: Self-pay | Admitting: Family Medicine

## 2021-09-06 DIAGNOSIS — Z1231 Encounter for screening mammogram for malignant neoplasm of breast: Secondary | ICD-10-CM

## 2021-09-09 ENCOUNTER — Ambulatory Visit
Admission: RE | Admit: 2021-09-09 | Discharge: 2021-09-09 | Disposition: A | Discharge status: Home / Self Care | Payer: Medicare HMO | Source: Ambulatory Visit | Attending: Family Medicine | Admitting: Family Medicine

## 2021-09-09 DIAGNOSIS — Z1231 Encounter for screening mammogram for malignant neoplasm of breast: Secondary | ICD-10-CM

## 2021-09-10 ENCOUNTER — Ambulatory Visit: Payer: Medicare HMO

## 2021-09-30 DIAGNOSIS — M778 Other enthesopathies, not elsewhere classified: Secondary | ICD-10-CM | POA: Diagnosis not present

## 2021-10-07 DIAGNOSIS — M79644 Pain in right finger(s): Secondary | ICD-10-CM | POA: Diagnosis not present

## 2021-10-14 DIAGNOSIS — M65311 Trigger thumb, right thumb: Secondary | ICD-10-CM | POA: Diagnosis not present

## 2021-10-14 DIAGNOSIS — M79641 Pain in right hand: Secondary | ICD-10-CM | POA: Diagnosis not present

## 2021-10-27 DIAGNOSIS — E1140 Type 2 diabetes mellitus with diabetic neuropathy, unspecified: Secondary | ICD-10-CM | POA: Diagnosis not present

## 2021-10-27 DIAGNOSIS — E119 Type 2 diabetes mellitus without complications: Secondary | ICD-10-CM | POA: Diagnosis not present

## 2021-10-28 DIAGNOSIS — I10 Essential (primary) hypertension: Secondary | ICD-10-CM | POA: Diagnosis not present

## 2021-10-28 DIAGNOSIS — E7800 Pure hypercholesterolemia, unspecified: Secondary | ICD-10-CM | POA: Diagnosis not present

## 2021-10-28 DIAGNOSIS — E1143 Type 2 diabetes mellitus with diabetic autonomic (poly)neuropathy: Secondary | ICD-10-CM | POA: Diagnosis not present

## 2021-11-04 DIAGNOSIS — M65311 Trigger thumb, right thumb: Secondary | ICD-10-CM | POA: Diagnosis not present

## 2021-11-19 DIAGNOSIS — Z79899 Other long term (current) drug therapy: Secondary | ICD-10-CM | POA: Diagnosis not present

## 2021-11-19 DIAGNOSIS — E1140 Type 2 diabetes mellitus with diabetic neuropathy, unspecified: Secondary | ICD-10-CM | POA: Diagnosis not present

## 2021-11-19 DIAGNOSIS — Z1389 Encounter for screening for other disorder: Secondary | ICD-10-CM | POA: Diagnosis not present

## 2021-11-19 DIAGNOSIS — E7800 Pure hypercholesterolemia, unspecified: Secondary | ICD-10-CM | POA: Diagnosis not present

## 2021-11-19 DIAGNOSIS — Z0000 Encounter for general adult medical examination without abnormal findings: Secondary | ICD-10-CM | POA: Diagnosis not present

## 2021-11-23 DIAGNOSIS — E1140 Type 2 diabetes mellitus with diabetic neuropathy, unspecified: Secondary | ICD-10-CM | POA: Diagnosis not present

## 2021-11-23 DIAGNOSIS — E1143 Type 2 diabetes mellitus with diabetic autonomic (poly)neuropathy: Secondary | ICD-10-CM | POA: Diagnosis not present

## 2021-11-23 DIAGNOSIS — I10 Essential (primary) hypertension: Secondary | ICD-10-CM | POA: Diagnosis not present

## 2021-11-23 DIAGNOSIS — E7800 Pure hypercholesterolemia, unspecified: Secondary | ICD-10-CM | POA: Diagnosis not present

## 2021-11-23 DIAGNOSIS — E669 Obesity, unspecified: Secondary | ICD-10-CM | POA: Diagnosis not present

## 2022-02-22 DIAGNOSIS — E1140 Type 2 diabetes mellitus with diabetic neuropathy, unspecified: Secondary | ICD-10-CM | POA: Diagnosis not present

## 2022-03-30 DIAGNOSIS — E1143 Type 2 diabetes mellitus with diabetic autonomic (poly)neuropathy: Secondary | ICD-10-CM | POA: Diagnosis not present

## 2022-03-30 DIAGNOSIS — E7800 Pure hypercholesterolemia, unspecified: Secondary | ICD-10-CM | POA: Diagnosis not present

## 2022-03-30 DIAGNOSIS — I10 Essential (primary) hypertension: Secondary | ICD-10-CM | POA: Diagnosis not present

## 2022-05-15 ENCOUNTER — Emergency Department (HOSPITAL_COMMUNITY): Payer: Medicare (Managed Care)

## 2022-05-15 ENCOUNTER — Emergency Department (HOSPITAL_COMMUNITY)
Admission: EM | Admit: 2022-05-15 | Discharge: 2022-05-15 | Disposition: A | Discharge status: Home / Self Care | Payer: Medicare (Managed Care) | Attending: Emergency Medicine | Admitting: Emergency Medicine

## 2022-05-15 ENCOUNTER — Encounter (HOSPITAL_COMMUNITY): Payer: Self-pay

## 2022-05-15 DIAGNOSIS — R109 Unspecified abdominal pain: Secondary | ICD-10-CM | POA: Diagnosis not present

## 2022-05-15 DIAGNOSIS — I447 Left bundle-branch block, unspecified: Secondary | ICD-10-CM | POA: Insufficient documentation

## 2022-05-15 DIAGNOSIS — Z79899 Other long term (current) drug therapy: Secondary | ICD-10-CM | POA: Diagnosis not present

## 2022-05-15 DIAGNOSIS — E1149 Type 2 diabetes mellitus with other diabetic neurological complication: Secondary | ICD-10-CM | POA: Insufficient documentation

## 2022-05-15 DIAGNOSIS — I10 Essential (primary) hypertension: Secondary | ICD-10-CM | POA: Insufficient documentation

## 2022-05-15 DIAGNOSIS — Z20822 Contact with and (suspected) exposure to covid-19: Secondary | ICD-10-CM | POA: Diagnosis not present

## 2022-05-15 DIAGNOSIS — K5730 Diverticulosis of large intestine without perforation or abscess without bleeding: Secondary | ICD-10-CM | POA: Diagnosis not present

## 2022-05-15 DIAGNOSIS — R202 Paresthesia of skin: Secondary | ICD-10-CM | POA: Diagnosis not present

## 2022-05-15 DIAGNOSIS — R200 Anesthesia of skin: Secondary | ICD-10-CM | POA: Diagnosis not present

## 2022-05-15 DIAGNOSIS — Z7984 Long term (current) use of oral hypoglycemic drugs: Secondary | ICD-10-CM | POA: Diagnosis not present

## 2022-05-15 DIAGNOSIS — R079 Chest pain, unspecified: Secondary | ICD-10-CM | POA: Diagnosis not present

## 2022-05-15 DIAGNOSIS — E1140 Type 2 diabetes mellitus with diabetic neuropathy, unspecified: Secondary | ICD-10-CM | POA: Diagnosis not present

## 2022-05-15 DIAGNOSIS — I700 Atherosclerosis of aorta: Secondary | ICD-10-CM | POA: Diagnosis not present

## 2022-05-15 DIAGNOSIS — R0602 Shortness of breath: Secondary | ICD-10-CM | POA: Diagnosis present

## 2022-05-15 DIAGNOSIS — R0789 Other chest pain: Secondary | ICD-10-CM | POA: Diagnosis not present

## 2022-05-15 DIAGNOSIS — R0600 Dyspnea, unspecified: Secondary | ICD-10-CM | POA: Diagnosis not present

## 2022-05-15 DIAGNOSIS — Z90710 Acquired absence of both cervix and uterus: Secondary | ICD-10-CM | POA: Diagnosis not present

## 2022-05-15 LAB — BLOOD GAS, VENOUS
Acid-Base Excess: 3.9 mmol/L — ABNORMAL HIGH (ref 0.0–2.0)
Bicarbonate: 30.2 mmol/L — ABNORMAL HIGH (ref 20.0–28.0)
O2 Saturation: 58.6 %
Patient temperature: 37.1
pCO2, Ven: 51 mmHg (ref 44–60)
pH, Ven: 7.38 (ref 7.25–7.43)
pO2, Ven: 34 mmHg (ref 32–45)

## 2022-05-15 LAB — RESP PANEL BY RT-PCR (RSV, FLU A&B, COVID)  RVPGX2
Influenza A by PCR: NEGATIVE
Influenza B by PCR: NEGATIVE
Resp Syncytial Virus by PCR: NEGATIVE
SARS Coronavirus 2 by RT PCR: NEGATIVE

## 2022-05-15 LAB — CBC WITH DIFFERENTIAL/PLATELET
Abs Immature Granulocytes: 0.01 10*3/uL (ref 0.00–0.07)
Basophils Absolute: 0 10*3/uL (ref 0.0–0.1)
Basophils Relative: 1 %
Eosinophils Absolute: 0.1 10*3/uL (ref 0.0–0.5)
Eosinophils Relative: 2 %
HCT: 43 % (ref 36.0–46.0)
Hemoglobin: 13.7 g/dL (ref 12.0–15.0)
Immature Granulocytes: 0 %
Lymphocytes Relative: 42 %
Lymphs Abs: 3.3 10*3/uL (ref 0.7–4.0)
MCH: 28.7 pg (ref 26.0–34.0)
MCHC: 31.9 g/dL (ref 30.0–36.0)
MCV: 90 fL (ref 80.0–100.0)
Monocytes Absolute: 0.6 10*3/uL (ref 0.1–1.0)
Monocytes Relative: 8 %
Neutro Abs: 3.8 10*3/uL (ref 1.7–7.7)
Neutrophils Relative %: 47 %
Platelets: 236 10*3/uL (ref 150–400)
RBC: 4.78 MIL/uL (ref 3.87–5.11)
RDW: 12.6 % (ref 11.5–15.5)
WBC: 7.8 10*3/uL (ref 4.0–10.5)
nRBC: 0 % (ref 0.0–0.2)

## 2022-05-15 LAB — I-STAT CHEM 8, ED
BUN: 16 mg/dL (ref 8–23)
Calcium, Ion: 1.25 mmol/L (ref 1.15–1.40)
Chloride: 103 mmol/L (ref 98–111)
Creatinine, Ser: 0.9 mg/dL (ref 0.44–1.00)
Glucose, Bld: 152 mg/dL — ABNORMAL HIGH (ref 70–99)
HCT: 43 % (ref 36.0–46.0)
Hemoglobin: 14.6 g/dL (ref 12.0–15.0)
Potassium: 4.4 mmol/L (ref 3.5–5.1)
Sodium: 142 mmol/L (ref 135–145)
TCO2: 29 mmol/L (ref 22–32)

## 2022-05-15 LAB — BASIC METABOLIC PANEL
Anion gap: 7 (ref 5–15)
BUN: 15 mg/dL (ref 8–23)
CO2: 26 mmol/L (ref 22–32)
Calcium: 9.4 mg/dL (ref 8.9–10.3)
Chloride: 104 mmol/L (ref 98–111)
Creatinine, Ser: 0.86 mg/dL (ref 0.44–1.00)
GFR, Estimated: >60 mL/min (ref >60)
Glucose, Bld: 145 mg/dL — ABNORMAL HIGH (ref 70–99)
Potassium: 3.9 mmol/L (ref 3.5–5.1)
Sodium: 137 mmol/L (ref 135–145)

## 2022-05-15 LAB — PROTIME-INR
INR: 1.1 (ref 0.8–1.2)
Prothrombin Time: 14.4 seconds (ref 11.4–15.2)

## 2022-05-15 LAB — TROPONIN I (HIGH SENSITIVITY)
Troponin I (High Sensitivity): 7 ng/L (ref <18)
Troponin I (High Sensitivity): 10 ng/L (ref <18)

## 2022-05-15 LAB — BRAIN NATRIURETIC PEPTIDE: B Natriuretic Peptide: 25.4 pg/mL (ref 0.0–100.0)

## 2022-05-15 MED ORDER — METHOCARBAMOL 500 MG PO TABS: 500.0000 mg | ORAL_TABLET | Freq: Two times a day (BID) | ORAL | 0 refills | Status: AC

## 2022-05-15 MED ORDER — SODIUM CHLORIDE 0.9 % IV BOLUS
1000.0000 mL | Freq: Once | INTRAVENOUS | Status: AC
  Administered 2022-05-15: 1000 mL via INTRAVENOUS

## 2022-05-15 MED ORDER — IOHEXOL 350 MG/ML SOLN
75.0000 mL | Freq: Once | INTRAVENOUS | Status: AC | PRN
  Administered 2022-05-15: 75 mL via INTRAVENOUS

## 2022-05-15 MED ORDER — ONDANSETRON HCL 4 MG/2ML IJ SOLN
4.0000 mg | Freq: Once | INTRAMUSCULAR | Status: DC
  Filled 2022-05-15: qty 2

## 2022-05-15 MED ORDER — FAMOTIDINE IN NACL 20-0.9 MG/50ML-% IV SOLN
20.0000 mg | Freq: Once | INTRAVENOUS | Status: AC
  Administered 2022-05-15: 20 mg via INTRAVENOUS
  Filled 2022-05-15: qty 50

## 2022-05-15 MED ORDER — KETOROLAC TROMETHAMINE 15 MG/ML IJ SOLN
15.0000 mg | Freq: Once | INTRAMUSCULAR | Status: AC
  Administered 2022-05-15: 15 mg via INTRAVENOUS
  Filled 2022-05-15: qty 1

## 2022-05-15 MED ORDER — METHOCARBAMOL 500 MG PO TABS
500.0000 mg | ORAL_TABLET | Freq: Once | ORAL | Status: AC
  Administered 2022-05-15: 500 mg via ORAL
  Filled 2022-05-15: qty 1

## 2022-05-15 MED ORDER — HYDROCODONE-ACETAMINOPHEN 5-325 MG PO TABS: 1.0000 | ORAL_TABLET | Freq: Four times a day (QID) | ORAL | 0 refills | Status: AC | PRN

## 2022-05-15 MED ORDER — HYDROMORPHONE HCL 1 MG/ML IJ SOLN
0.5000 mg | Freq: Once | INTRAMUSCULAR | Status: AC
  Administered 2022-05-15: 0.5 mg via INTRAVENOUS
  Filled 2022-05-15: qty 1

## 2022-05-15 MED ORDER — IOHEXOL 350 MG/ML SOLN
100.0000 mL | Freq: Once | INTRAVENOUS | Status: AC | PRN
  Administered 2022-05-15: 100 mL via INTRAVENOUS

## 2022-05-15 NOTE — ED Triage Notes (Signed)
CP that started Tuesday. She initially thought this was indigestion. 2hrs ago became much worse. Describes as L chest pain that radiates into the back and L arm, tearing sensation. She has no heart hx but is a diabetic. Nonsmoker. Endorses sob.

## 2022-05-15 NOTE — ED Notes (Signed)
Report called over to [facility]. Spoke with Jinny Blossom who stated that a room will be reserved for patient.

## 2022-05-15 NOTE — ED Notes (Addendum)
Pt ambulated to restroom and had dyspnea with exertion. SpO2 100%

## 2022-05-15 NOTE — ED Notes (Signed)
Spoke with [facility] and gave report.

## 2022-05-15 NOTE — ED Notes (Signed)
[facility] transferred patient

## 2022-05-15 NOTE — ED Provider Notes (Signed)
Patient transferred here from [facility] today for a gated CTA to rule out aortic root dissection.  Patient is complaining of pain but earlier in the week the pain was in her spine and reports last night it settled more in her chest and arm.  Patient was initially seen at [facility] and had a CTA but there was a lot of artifact.  Currently patient reports that she feels okay but has some tightness in her left shoulder.  She is denying any shortness of breath at this time.  She has no abdominal pain at this time.  Pain does seem to be worse with certain movements and positions.    3:04 PM CT is negative here for acute aortic abnormalities.  Patient is still having pain when she moves her neck a certain way and when she takes a deep breath.  She felt slightly short of breath when she ambulated to the bathroom but sats were 100%.  Most likely related to pain.  Patient was found to have a left bundle branch block on her EKG today but last to compare to was 2003.  Feel this is most likely not a new finding.  Her troponins have been flat and negative.  Findings discussed with the patient and her family members who are present at bedside.  At this time we will treat for concern for musculoskeletal etiology but recommend she follow-up with her doctor.  In the future may need to see cardiology she continues to have issues given her known risk factors.   Blanchie Dessert, MD 05/15/22 [phone]

## 2022-05-15 NOTE — Discharge Instructions (Addendum)
It was a pleasure caring for you today in the emergency department.  Please return to the emergency department for any worsening or worrisome symptoms such as fever, severe shortness of breath, new abdominal pain, vomiting, passing out or inability to walk.  If you continue to have symptoms it is very important that you follow-up with your doctor and you may need to see a cardiologist in the future.  Thankfully today the test on your heart were all normal except for your EKG that is abnormal but it is unclear how long it has been that way and can be followed up by your doctor.  You were given pain medicine and muscle relaxer to help with the pain you have when turning her head.  You can also try muscle rubs or heating pad.  You can use the muscle relaxer as needed and the pain medication but do not use them at the same time.  If you have to use the pain medicine more than a few doses in a row make sure you are taking something to prevent constipation.

## 2022-05-15 NOTE — ED Provider Notes (Signed)
[place] DEPT Provider Note  CSN: [id number] Arrival date & time: 05/15/22 5809  Chief Complaint(s) Chest Pain  HPI Jenny Wolf is a 77 y.o. female with past medical history as below, significant for DM, HLD, HTN, obesity who presents to the ED with complaint of chest pain, difficulty breathing, arm numbness/tingling.  Patient reports has been having mild chest/back discomfort over the past week, became acutely worse approximately 2 hours prior to arrival.  Pain described as sharp, ripping, tearing from left chest wall radiating to left shoulder/upper back.  Also having some numbness/tingling going down her left arm.  Denies similar symptoms in the past to this severity.  Having some mild dyspnea that began this evening as well.  No fevers or chills.  No recent falls or injuries.  No significant medication or diet changes.  Denies similar symptoms in the past.  Accompanied by spouse  Pain worsened by torso movement and deep inspiration  Past Medical History Past Medical History:  Diagnosis Date   Carpal tunnel syndrome, left    mild   Diabetes mellitus with neurological manifestations, controlled ([place])    diabetic with peripheral neuropathy   Diabetic neuropathy ([place])    Glaucoma    Hyperlipidemia    Hypertension    Obesity    Paresthesias    L and R hand    Patient Active Problem List   Diagnosis Date Noted   Diabetes mellitus with neurological manifestations ([place]) 05/01/2014   Essential hypertension 05/01/2014   Hyperlipidemia 05/01/2014   Diabetic neuropathy ([place]) 05/01/2014   Vitamin D deficiency 05/01/2014   Home Medication(s) Prior to Admission medications   Medication Sig Start Date End Date Taking? Authorizing Provider  diclofenac sodium (VOLTAREN) 1 % GEL Apply 2 g topically 4 (four) times daily. Patient not taking: Reported on 01/28/2018 03/08/15   Joy, Shawn C, PA-C  glipiZIDE (GLUCOTROL XL) 5 MG 24 hr tablet Take 5 mg by mouth  daily with breakfast.    [provider]  HYDROcodone-acetaminophen (NORCO) 5-325 MG tablet Take 1 tablet by mouth every 6 (six) hours as needed. 01/28/18   Jacqlyn Larsen, PA-C  ibuprofen (ADVIL,MOTRIN) 800 MG tablet Take 1 tablet (800 mg total) by mouth 3 (three) times daily. Patient not taking: Reported on 01/28/2018 03/08/15   Joy, Raquel Sarna C, PA-C  latanoprost (XALATAN) 0.005 % ophthalmic solution Place 1 drop into both eyes at bedtime.    [provider]  lisinopril (PRINIVIL,ZESTRIL) 20 MG tablet Take 20 mg by mouth daily.    [provider]  metFORMIN (GLUCOPHAGE) 1000 MG tablet Take 1,000 mg by mouth daily with breakfast.     [provider]  naproxen (NAPROSYN) 375 MG tablet Take 1 tablet (375 mg total) by mouth 2 (two) times daily with a meal. 12/04/18   Nanavati, Ankit, MD  timolol (BETIMOL) 0.5 % ophthalmic solution Place 1 drop into both eyes 2 (two) times daily.    [provider]  tiZANidine (ZANAFLEX) 2 MG tablet Take 1 tablet (2 mg total) by mouth every 8 (eight) hours as needed for muscle spasms. 12/04/18   Varney Biles, MD  traMADol (ULTRAM) 50 MG tablet Take 1 tablet (50 mg total) by mouth every 6 (six) hours as needed. Patient not taking: Reported on 01/28/2018 03/07/15   Delsa Grana, PA-C  Past Surgical History Past Surgical History:  Procedure Laterality Date   BREAST EXCISIONAL BIOPSY Right 2000   Family History Family History  Problem Relation Age of Onset   Breast cancer Neg Hx     Social History Social History   Tobacco Use   Smoking status: Never  Substance Use Topics   Alcohol use: Yes    Alcohol/week: 1.0 standard drink of alcohol    Types: 1 Glasses of wine per week    Comment: socially   Drug use: No   Allergies Apple juice  Review of Systems Review of Systems  Constitutional:   Negative for activity change and fever.  HENT:  Negative for facial swelling and trouble swallowing.   Eyes:  Negative for discharge and redness.  Respiratory:  Positive for shortness of breath. Negative for cough.   Cardiovascular:  Positive for chest pain. Negative for palpitations.  Gastrointestinal:  Negative for abdominal pain and nausea.  Genitourinary:  Negative for dysuria and flank pain.  Musculoskeletal:  Positive for back pain. Negative for gait problem.  Skin:  Negative for pallor and rash.  Neurological:  Positive for numbness. Negative for syncope and headaches.       Numbness/tingling to left arm    Physical Exam Vital Signs  I have reviewed the triage vital signs BP (!) 147/75   Pulse 93   Temp 97.8 F (36.6 C) (Oral)   Resp 18   Wt 83.5 kg   SpO2 98%   BMI 29.70 kg/m  Physical Exam Vitals and nursing note reviewed.  Constitutional:      General: She is not in acute distress.    Appearance: Normal appearance. She is well-developed. She is not diaphoretic.  HENT:     Head: Normocephalic and atraumatic.     Right Ear: External ear normal.     Left Ear: External ear normal.     Nose: Nose normal.     Mouth/Throat:     Mouth: Mucous membranes are moist.  Eyes:     General: No scleral icterus.       Right eye: No discharge.        Left eye: No discharge.     Extraocular Movements: Extraocular movements intact.     Pupils: Pupils are equal, round, and reactive to light.  Cardiovascular:     Rate and Rhythm: Normal rate and regular rhythm.     Pulses: Normal pulses.     Heart sounds: Normal heart sounds.     No S3 or S4 sounds.  Pulmonary:     Effort: Pulmonary effort is normal. No respiratory distress.     Breath sounds: Normal breath sounds. No decreased breath sounds.  Abdominal:     General: Abdomen is flat.     Tenderness: There is no abdominal tenderness.  Musculoskeletal:        General: Normal range of motion.     Cervical back: Normal range of  motion.     Right lower leg: No edema.     Left lower leg: No edema.  Skin:    General: Skin is warm and dry.     Capillary Refill: Capillary refill takes less than 2 seconds.  Neurological:     Mental Status: She is alert and oriented to person, place, and time.     GCS: GCS eye subscore is 4. GCS verbal subscore is 5. GCS motor subscore is 6.     Cranial Nerves: Cranial nerves 2-12 are intact.  Sensory: Sensation is intact.     Motor: Motor function is intact.     Coordination: Coordination is intact.     Comments: Paresthesias left arm B/L UE are NVI  Psychiatric:        Mood and Affect: Mood normal.        Behavior: Behavior normal.     ED Results and Treatments Labs (all labs ordered are listed, but only abnormal results are displayed) Labs Reviewed  BASIC METABOLIC PANEL - Abnormal; Notable for the following components:      Result Value   Glucose, Bld 145 (*)    All other components within normal limits  BLOOD GAS, VENOUS - Abnormal; Notable for the following components:   Bicarbonate 30.2 (*)    Acid-Base Excess 3.9 (*)    All other components within normal limits  I-STAT CHEM 8, ED - Abnormal; Notable for the following components:   Glucose, Bld 152 (*)    All other components within normal limits  RESP PANEL BY RT-PCR (RSV, FLU A&B, COVID)  RVPGX2  BRAIN NATRIURETIC PEPTIDE  CBC WITH DIFFERENTIAL/PLATELET  PROTIME-INR  TROPONIN I (HIGH SENSITIVITY)  TROPONIN I (HIGH SENSITIVITY)                                                                                                                          Radiology CT Angio Chest/Abd/Pel for Dissection W and/or Wo Contrast  Result Date: 05/15/2022 CLINICAL DATA:  77 year old female with history of sudden onset of ripping chest pain with arm numbness. Evaluate for acute aortic syndrome. EXAM: CT ANGIOGRAPHY CHEST, ABDOMEN AND PELVIS TECHNIQUE: Non-contrast CT of the chest was initially obtained. Multidetector CT  imaging through the chest, abdomen and pelvis was performed using the standard protocol during bolus administration of intravenous contrast. Multiplanar reconstructed images and MIPs were obtained and reviewed to evaluate the vascular anatomy. RADIATION DOSE REDUCTION: This exam was performed according to the departmental dose-optimization program which includes automated exposure control, adjustment of the mA and/or kV according to patient size and/or use of iterative reconstruction technique. CONTRAST:  113m OMNIPAQUE IOHEXOL 350 MG/ML SOLN COMPARISON:  CT of the abdomen and pelvis 01/28/2018. No prior chest CT. FINDINGS: CTA CHEST FINDINGS Cardiovascular: Assessment of the thoracic aorta is limited by considerable cardiac motion. With these limitations in mind, there may be very subtle mural high attenuation (axial image 23 of series 4), which could suggest the presence of acute intramural hematoma. Postcontrast images demonstrate normal caliber of the thoracic aorta which is estimated to measure approximately 3.3 cm, 2.8 cm and 2.2 cm in diameter in the ascending thoracic aorta, mid arch and descending thoracic aorta respectively. However, extensive pulsation artifact affects assessment of the ascending thoracic aorta resulting in what appears to be a pseudo dissection on several images. Strictly speaking, true aortic dissection is not excluded, but is not strongly favored. Heart size is normal. There is no significant pericardial fluid, thickening or pericardial calcification. Minimal aortic atherosclerosis. No  coronary artery calcifications. Mediastinum/Nodes: No high attenuation fluid collection in the mediastinum to suggest mediastinal hematoma. No pathologically enlarged mediastinal or hilar lymph nodes. Esophagus is unremarkable in appearance. No axillary lymphadenopathy. Lungs/Pleura: No acute consolidative airspace disease. No pleural effusions. Minimal scarring or subsegmental atelectasis noted in the  lung bases. No suspicious appearing pulmonary nodules or masses are noted. Musculoskeletal: There are no aggressive appearing lytic or blastic lesions noted in the visualized portions of the skeleton. Review of the MIP images confirms the above findings. CTA ABDOMEN AND PELVIS FINDINGS VASCULAR Aorta: Mild aortic atherosclerosis. Normal caliber aorta without aneurysm, dissection, vasculitis or significant stenosis. Celiac: Patent without evidence of aneurysm, dissection, vasculitis or significant stenosis. SMA: Patent without evidence of aneurysm, dissection, vasculitis or significant stenosis. Renals: Both renal arteries are patent without evidence of aneurysm, dissection, vasculitis, fibromuscular dysplasia or significant stenosis. IMA: Patent without evidence of aneurysm, dissection, vasculitis or significant stenosis. Inflow: Patent without evidence of aneurysm, dissection, vasculitis or significant stenosis. Veins: No obvious venous abnormality within the limitations of this arterial phase study. Review of the MIP images confirms the above findings. NON-VASCULAR Hepatobiliary: No suspicious cystic or solid hepatic lesions. No intra or extrahepatic biliary ductal dilatation. Gallbladder is normal in appearance. Pancreas: No pancreatic mass. No pancreatic ductal dilatation. No pancreatic or peripancreatic fluid collections or inflammatory changes. Spleen: Unremarkable. Adrenals/Urinary Tract: Bilateral kidneys and bilateral adrenal glands are normal in appearance. No hydroureteronephrosis. Urinary bladder is normal in appearance. Stomach/Bowel: The appearance of the stomach is normal. No pathologic dilatation of small bowel or colon. A few scattered colonic diverticula are noted, without surrounding inflammatory changes to suggest an acute diverticulitis at this time. Normal appendix. Lymphatic: No lymphadenopathy noted in the abdomen or pelvis. Reproductive: Status post hysterectomy. Ovaries are unremarkable in  appearance. Other: No significant volume of ascites.  No pneumoperitoneum. Musculoskeletal: There are no aggressive appearing lytic or blastic lesions noted in the visualized portions of the skeleton. Review of the MIP images confirms the above findings. IMPRESSION: 1. Accurate assessment for acute aortic syndrome is substantially limited by cardiac motion on today's study. There are findings in the ascending thoracic aorta on today's motion limited examination which could represent acute aortic dissection and/or intramural hematoma, however, this is all favored to be artifactual secondary to cardiac motion. Further clinical evaluation is recommended. If there is strong clinical concern for acute aortic syndrome, repeat cardiac gated chest CTA would be recommended. 2. No other potential acute findings noted elsewhere in the chest, abdomen or pelvis. 3. Mild aortic atherosclerosis. 4. Additional incidental findings, as above. These results were called by telephone at the time of interpretation on 05/15/2022 at 6:48 am to [facility] who verbally acknowledged these results. Electronically Signed   By: Vinnie Langton M.D.   On: 05/15/2022 06:53   DG Chest Port 1 View  Result Date: 05/15/2022 CLINICAL DATA:  Chest pain. EXAM: PORTABLE CHEST 1 VIEW COMPARISON:  None Available. FINDINGS: The heart size and mediastinal contours are within normal limits. Both lungs are clear. The visualized skeletal structures are unremarkable. IMPRESSION: No active disease. Electronically Signed   By: Telford Nab M.D.   On: 05/15/2022 06:09    Pertinent labs & imaging results that were available during my care of the patient were reviewed by me and considered in my medical decision making (see MDM for details).  Medications Ordered in ED Medications  ondansetron (ZOFRAN) injection 4 mg (4 mg Intravenous Patient Refused/Not Given 05/15/22 0737)  famotidine (PEPCID) IVPB 20  mg premix (has no administration in time range)   sodium chloride 0.9 % bolus 1,000 mL (1,000 mLs Intravenous New Bag/Given 05/15/22 0633)  iohexol (OMNIPAQUE) 350 MG/ML injection 100 mL (100 mLs Intravenous Contrast Given 05/15/22 0611)  HYDROmorphone (DILAUDID) injection 0.5 mg (0.5 mg Intravenous Given 05/15/22 6283)                                                                                                                                     Procedures Procedures  (including critical care time)  Medical Decision Making / ED Course   MDM:  Posie Lillibridge is a 77 y.o. female with past medical history as below, significant for DM, HLD, HTN, obesity who presents to the ED with complaint of chest pain, difficulty breathing, arm numbness/tingling.. The complaint involves an extensive differential diagnosis and also carries with it a high risk of complications and morbidity.  Serious etiology was considered. Ddx includes but is not limited to: Differential includes all life-threatening causes for chest pain. This includes but is not exclusive to acute coronary syndrome, aortic dissection, pulmonary embolism, cardiac tamponade, community-acquired pneumonia, pericarditis, musculoskeletal chest wall pain, etc. In my evaluation of this patient's dyspnea my DDx includes, but is not limited to, pneumonia, pulmonary embolism, pneumothorax, pulmonary edema, metabolic acidosis, asthma, COPD, cardiac cause, anemia, anxiety, etc.    On initial assessment the patient is: Discomfort on arrival, she is mildly hypotensive.  Heart rate is normal.  Breathing comfortably on ambient air. CP present left chest wall radiating to her back Vital signs and nursing notes were reviewed  Clinical Course as of 05/15/22 0745  Nancy Fetter May 15, 2022  1517 Received call back from radiologist, there is cardiac motion artifact on imaging, unable to explicitly rule out an ascending thoracic dissection however radiology has very low suspicion for dissection at this time,  strongly favors artifact.  Reports that patient will need a gated angio to better differentiate given cardiac motion artifact [SG]  0731 HEART score is 6 [SG]  0736 Spoke with cardiology, Dr Shellia Carwin; recommend o/p echo. No acute intervention for LBBB [SG]    Clinical Course User Index [SG] Wynona Dove A, DO   EKG reviewed, shows left bundle branch block which was not noted on prior EKG.  Patient with sudden onset left-sided chest pain described as ripping and tearing.  Hx HTN and DM, prior imaging w/o evidence of thoracic aneurysm. Will obtain CTA dissection protocol  Labs reviewed, trop negative x1, BMP stable, CBC stable, VBG stable.   CTA has resulted, see ed course above. She is still having chest pain but mildly improved; discussed with patient/spouse and her daughter who is an Therapist, sports and will plan to send her over to [facility] to receive rpt imaging to better evaluate for possible dissection.   She is HDS, clinically stable, agreeable to transport  Plan to txfr to [facility] for gated CTA; Dr Maryan Rued accepting.  If this imaging is negative and pt is feeling better, remaining labs are WNL should likely be able to discharge home w/o op cardiology f/u for echo for moderate risk chest pain       Additional history obtained: -Additional history obtained from spouse -External records from outside source obtained and reviewed including: Chart review including previous notes, labs, imaging, consultation notes including care documentation, prior labs and imaging, home medications   Lab Tests: -I ordered, reviewed, and interpreted labs.   The pertinent results include:   Labs Reviewed  BASIC METABOLIC PANEL - Abnormal; Notable for the following components:      Result Value   Glucose, Bld 145 (*)    All other components within normal limits  BLOOD GAS, VENOUS - Abnormal; Notable for the following components:   Bicarbonate 30.2 (*)    Acid-Base Excess 3.9 (*)    All other components within normal  limits  I-STAT CHEM 8, ED - Abnormal; Notable for the following components:   Glucose, Bld 152 (*)    All other components within normal limits  RESP PANEL BY RT-PCR (RSV, FLU A&B, COVID)  RVPGX2  BRAIN NATRIURETIC PEPTIDE  CBC WITH DIFFERENTIAL/PLATELET  PROTIME-INR  TROPONIN I (HIGH SENSITIVITY)  TROPONIN I (HIGH SENSITIVITY)    Notable for as above  EKG   EKG Interpretation  Date/Time:  Sunday May 15 2022 06:38:23 EST Ventricular Rate:  98 PR Interval:  176 QRS Duration: 145 QT Interval:  387 QTC Calculation: 495 R Axis:   105 Text Interpretation: Sinus rhythm Left bundle branch block Confirmed by Wynona Dove ([id number]) on 05/15/2022 6:47:38 AM         Imaging Studies ordered: I ordered imaging studies including chest x-ray, CTA dissection I independently visualized the following imaging with scope of interpretation limited to determining acute life threatening conditions related to emergency care: CXR/CTA, which revealed cxr wnl, CTA as above I independently visualized and interpreted imaging. I agree with the radiologist interpretation   Medicines ordered and prescription drug management: Meds ordered this encounter  Medications   sodium chloride 0.9 % bolus 1,000 mL   iohexol (OMNIPAQUE) 350 MG/ML injection 100 mL   HYDROmorphone (DILAUDID) injection 0.5 mg   ondansetron (ZOFRAN) injection 4 mg   famotidine (PEPCID) IVPB 20 mg premix    -I have reviewed the patients home medicines and have made adjustments as needed   Consultations Obtained: I requested consultation with radiology and cardiology on call; recommendations as above   Cardiac Monitoring: The patient was maintained on a cardiac monitor.  I personally viewed and interpreted the cardiac monitored which showed an underlying rhythm of: LBBB  Social Determinants of Health:  Diagnosis or treatment significantly limited by social determinants of health: Non-smoker   Reevaluation: After the  interventions noted above, I reevaluated the patient and found that they have improved  Co morbidities that complicate the patient evaluation  Past Medical History:  Diagnosis Date   Carpal tunnel syndrome, left    mild   Diabetes mellitus with neurological manifestations, controlled ([place])    diabetic with peripheral neuropathy   Diabetic neuropathy ([place])    Glaucoma    Hyperlipidemia    Hypertension    Obesity    Paresthesias    L and R hand       Dispostion: Disposition decision including need for hospitalization was considered, and patient transferred.    Final Clinical Impression(s) / ED Diagnoses Final diagnoses:  Chest pain, unspecified type  Left bundle  branch block  Moderate risk chest pain     This chart was dictated using voice recognition software.  Despite best efforts to proofread,  errors can occur which can change the documentation meaning.    Jeanell Sparrow, DO 05/15/22 0745

## 2022-05-15 NOTE — ED Notes (Signed)
Called [facility] to arrange transport, and was told that it has already been arranged and was just waiting for the truck.

## 2022-05-17 ENCOUNTER — Ambulatory Visit: Payer: Medicare (Managed Care) | Attending: Cardiology | Admitting: Cardiology

## 2022-05-17 ENCOUNTER — Encounter: Payer: Self-pay | Admitting: Cardiology

## 2022-05-17 VITALS — BP 128/76 | HR 84 | Ht 66.0 in | Wt 188.0 lb

## 2022-05-17 DIAGNOSIS — I10 Essential (primary) hypertension: Secondary | ICD-10-CM | POA: Diagnosis not present

## 2022-05-17 DIAGNOSIS — I447 Left bundle-branch block, unspecified: Secondary | ICD-10-CM

## 2022-05-17 NOTE — Addendum Note (Signed)
Addended by: Aris Georgia, Jaquel Glassburn L on: 05/17/2022 02:43 PM   Modules accepted: Orders

## 2022-05-17 NOTE — Progress Notes (Signed)
Cardiology CONSULT Note    Date:  05/17/2022   ID:  Jenny Wolf, DOB 13-Sep-1945, MRN [id number]  PCP:  Kathyrn Lass, MD Cardiologist:  Fransico Him, MD   Chief Complaint  Patient presents with   New Patient (Initial Visit)    LBBB    History of Present Illness:  Jenny Wolf is a 77 y.o. female who is being seen today for the evaluation of LBBB  and chest pain at the request of Kathyrn Lass, MD.  This is a 77yo AAF with a hx of carpal tunnel syndrome, DM, HLD, HTN who is referred for evaluation of chest pain and new LBBB on EKG.  She tells me that around 5am on Sunday morning she was up and had not gone to bed yet.  She started getting a pain in her shoulder with radiation into her left upper chest that she describes as a tearing pain.  Her arm then got tingly.  She denied any nausea or diaphoresis but felt SOB because every time she took a breath it hurt.  She says that she had gone upstairs to go to bed when it started.   The pain persisted and she went to the ER.  In the ER her hsTrop was normal at 7 and 10 and BNP normal.  Chest CTA showed no PE or aortic dissection with mild aortic atherosclerosis and small hiatal hernia. No coronary Ca was noted on CTA.  She was dx with musculoskeletal pain and started on Robaxin and narcotic pain meds. The pain has significantly improved and only has some mild residual deep pain with movement.   Past Medical History:  Diagnosis Date   Carpal tunnel syndrome, left    mild   Diabetes mellitus with neurological manifestations, controlled ([place])    diabetic with peripheral neuropathy   Diabetic neuropathy (HCC)    Glaucoma    Hyperlipidemia    Hypertension    Obesity    Paresthesias    L and R hand     Past Surgical History:  Procedure Laterality Date   BREAST EXCISIONAL BIOPSY Right 2000    Current Medications: Current Meds  Medication Sig   glipiZIDE (GLUCOTROL XL) 5 MG 24 hr tablet Take 5 mg by mouth daily with  breakfast.   HYDROcodone-acetaminophen (NORCO/VICODIN) 5-325 MG tablet Take 1 tablet by mouth every 6 (six) hours as needed for severe pain.   ibuprofen (ADVIL,MOTRIN) 800 MG tablet Take 1 tablet (800 mg total) by mouth 3 (three) times daily.   latanoprost (XALATAN) 0.005 % ophthalmic solution Place 1 drop into both eyes at bedtime.   lisinopril (PRINIVIL,ZESTRIL) 20 MG tablet Take 20 mg by mouth daily.   metFORMIN (GLUCOPHAGE) 1000 MG tablet Take 1,000 mg by mouth daily with breakfast.    methocarbamol (ROBAXIN) 500 MG tablet Take 1 tablet (500 mg total) by mouth 2 (two) times daily.   naproxen (NAPROSYN) 375 MG tablet Take 1 tablet (375 mg total) by mouth 2 (two) times daily with a meal.   timolol (BETIMOL) 0.5 % ophthalmic solution Place 1 drop into both eyes 2 (two) times daily.   tiZANidine (ZANAFLEX) 2 MG tablet Take 1 tablet (2 mg total) by mouth every 8 (eight) hours as needed for muscle spasms.   traMADol (ULTRAM) 50 MG tablet Take 1 tablet (50 mg total) by mouth every 6 (six) hours as needed.   [DISCONTINUED] diclofenac sodium (VOLTAREN) 1 % GEL Apply 2 g topically 4 (four) times daily.  Allergies:   Apple juice   Social History   Socioeconomic History   Marital status: Widowed    Spouse name: Not on file   Number of children: Not on file   Years of education: Not on file   Highest education level: Not on file  Occupational History   Not on file  Tobacco Use   Smoking status: Never   Smokeless tobacco: Not on file  Substance and Sexual Activity   Alcohol use: Yes    Alcohol/week: 1.0 standard drink of alcohol    Types: 1 Glasses of wine per week    Comment: socially   Drug use: No   Sexual activity: Not on file  Other Topics Concern   Not on file  Social History Narrative   Not on file   Social Determinants of Health   Financial Resource Strain: Not on file  Food Insecurity: Not on file  Transportation Needs: Not on file  Physical Activity: Not on file   Stress: Not on file  Social Connections: Not on file     Family History:  The patient's family history is not on file.   ROS:   Please see the history of present illness.    ROS All other systems reviewed and are negative.      No data to display             PHYSICAL EXAM:   VS:  Ht '5\' 6"'$  (1.676 m)   Wt 188 lb (85.3 kg)   BMI 30.34 kg/m    GEN: Well nourished, well developed, in no acute distress  HEENT: normal  Neck: no JVD, carotid bruits, or masses Cardiac: RRR; no murmurs, rubs, or gallops,no edema.  Intact distal pulses bilaterally.  Respiratory:  clear to auscultation bilaterally, normal work of breathing GI: soft, nontender, nondistended, + BS MS: no deformity or atrophy  Skin: warm and dry, no rash Neuro:  Alert and Oriented x 3, Strength and sensation are intact Psych: euthymic mood, full affect  Wt Readings from Last 3 Encounters:  05/17/22 188 lb (85.3 kg)  05/15/22 184 lb (83.5 kg)  12/04/18 192 lb (87.1 kg)      Studies/Labs Reviewed:   EKG:  EKG is not ordered today.    Recent Labs: 05/15/2022: B Natriuretic Peptide 25.4; BUN 15; BUN 16; Creatinine, Ser 0.86; Creatinine, Ser 0.90; Hemoglobin 13.7; Hemoglobin 14.6; Platelets 236; Potassium 3.9; Potassium 4.4; Sodium 137; Sodium 142   Lipid Panel No results found for: "CHOL", "TRIG", "HDL", "CHOLHDL", "VLDL", "LDLCALC", "LDLDIRECT"   Additional studies/ records that were reviewed today include:  none    ASSESSMENT:    1. LBBB (left bundle branch block)   2. Essential hypertension      PLAN:  In order of problems listed above:  LBBB -she does have CRFs including DM, HTN, HLD -she has had some CP but it sounds atypical and more like musculoskeletal pain that has improved with Robaxin and narcotic pain meds -chest CTA with no obvious coronary Ca -recommend coronary Ca score to assess future cardiac risk as well as Lexiscan myoview to rule out ischemia given her abnormal EKG -Shared  Decision Making/Informed Consent The risks [chest pain, shortness of breath, cardiac arrhythmias, dizziness, blood pressure fluctuations, myocardial infarction, stroke/transient ischemic attack, nausea, vomiting, allergic reaction, radiation exposure, metallic taste sensation and life-threatening complications (estimated to be 1 in 10,000)], benefits (risk stratification, diagnosing coronary artery disease, treatment guidance) and alternatives of a nuclear stress test were discussed in  detail with Ms. Milo and she agrees to proceed. -check 2D echo to assess LVF  2.  HTN -BP controlled on exam today -continue prescription drug management with Lisinopril '20mg'$  daily with PRN refills  Time Spent: 20 minutes total time of encounter, including 15 minutes spent in face-to-face patient care on the date of this encounter. This time includes coordination of care and counseling regarding above mentioned problem list. Remainder of non-face-to-face time involved reviewing chart documents/testing relevant to the patient encounter and documentation in the medical record. I have independently reviewed documentation from referring provider  Medication Adjustments/Labs and Tests Ordered: Current medicines are reviewed at length with the patient today.  Concerns regarding medicines are outlined above.  Medication changes, Labs and Tests ordered today are listed in the Patient Instructions below.  There are no Patient Instructions on file for this visit.   Signed, Fransico Him, MD  05/17/2022 2:21 PM    [address], [place], [place]  [address] Phone: [phone]; Fax: [phone]

## 2022-05-17 NOTE — Patient Instructions (Signed)
Medication Instructions:  Your physician recommends that you continue on your current medications as directed. Please refer to the Current Medication list given to you today.  *If you need a refill on your cardiac medications before your next appointment, please call your pharmacy*  Lab Work: If you have labs (blood work) drawn today and your tests are completely normal, you will receive your results only by: [place] (if you have MyChart) OR A paper copy in the mail If you have any lab test that is abnormal or we need to change your treatment, we will call you to review the results.  Testing/Procedures: Your physician has requested that you have a lexiscan myoview. For further information please visit [email or web address]. Please follow instruction sheet, as given.  Your physician has requested that you have an echocardiogram. Echocardiography is a painless test that uses sound waves to create images of your heart. It provides your doctor with information about the size and shape of your heart and how well your heart's chambers and valves are working. This procedure takes approximately one hour. There are no restrictions for this procedure. Please do NOT wear cologne, perfume, aftershave, or lotions (deodorant is allowed). Please arrive 15 minutes prior to your appointment time.  Cardiac CT scanning for calcium score, (CAT scanning), is a noninvasive, special x-ray that produces cross-sectional images of the body using x-rays and a computer. CT scans help physicians diagnose and treat medical conditions. For some CT exams, a contrast material is used to enhance visibility in the area of the body being studied. CT scans provide greater clarity and reveal more details than regular x-ray exams.  Follow-Up: At [facility], you and your health needs are our priority.  As part of our continuing mission to provide you with exceptional heart care, we have created designated Provider  Care Teams.  These Care Teams include your primary Cardiologist (physician) and Advanced Practice Providers (APPs -  Physician Assistants and Nurse Practitioners) who all work together to provide you with the care you need, when you need it.  We recommend signing up for the patient portal called "MyChart".  Sign up information is provided on this After Visit Summary.  MyChart is used to connect with patients for Virtual Visits (Telemedicine).  Patients are able to view lab/test results, encounter notes, upcoming appointments, etc.  Non-urgent messages can be sent to your provider as well.   To learn more about what you can do with MyChart, go to [email or web address].    Your next appointment:   As needed  Provider:   Fransico Him, MD

## 2022-05-19 DIAGNOSIS — E1140 Type 2 diabetes mellitus with diabetic neuropathy, unspecified: Secondary | ICD-10-CM | POA: Diagnosis not present

## 2022-05-24 DIAGNOSIS — E0821 Diabetes mellitus due to underlying condition with diabetic nephropathy: Secondary | ICD-10-CM | POA: Diagnosis not present

## 2022-05-24 DIAGNOSIS — E7800 Pure hypercholesterolemia, unspecified: Secondary | ICD-10-CM | POA: Diagnosis not present

## 2022-05-24 NOTE — Addendum Note (Signed)
Addended by: Sueanne Margarita on: 05/24/2022 08:05 AM   Modules accepted: Orders

## 2022-05-30 ENCOUNTER — Ambulatory Visit (HOSPITAL_COMMUNITY)
Admission: RE | Admit: 2022-05-30 | Discharge: 2022-05-30 | Disposition: A | Discharge status: Home / Self Care | Payer: Medicare (Managed Care) | Source: Ambulatory Visit | Attending: Cardiology | Admitting: Cardiology

## 2022-05-30 DIAGNOSIS — I10 Essential (primary) hypertension: Secondary | ICD-10-CM | POA: Insufficient documentation

## 2022-05-30 DIAGNOSIS — I447 Left bundle-branch block, unspecified: Secondary | ICD-10-CM

## 2022-05-31 DIAGNOSIS — H2513 Age-related nuclear cataract, bilateral: Secondary | ICD-10-CM | POA: Diagnosis not present

## 2022-05-31 DIAGNOSIS — H35033 Hypertensive retinopathy, bilateral: Secondary | ICD-10-CM | POA: Diagnosis not present

## 2022-05-31 DIAGNOSIS — H4010X2 Unspecified open-angle glaucoma, moderate stage: Secondary | ICD-10-CM | POA: Diagnosis not present

## 2022-05-31 DIAGNOSIS — E119 Type 2 diabetes mellitus without complications: Secondary | ICD-10-CM | POA: Diagnosis not present

## 2022-05-31 DIAGNOSIS — H53431 Sector or arcuate defects, right eye: Secondary | ICD-10-CM | POA: Diagnosis not present

## 2022-06-01 ENCOUNTER — Encounter: Payer: Self-pay | Admitting: Cardiology

## 2022-06-01 DIAGNOSIS — I700 Atherosclerosis of aorta: Secondary | ICD-10-CM | POA: Insufficient documentation

## 2022-06-02 DIAGNOSIS — E1140 Type 2 diabetes mellitus with diabetic neuropathy, unspecified: Secondary | ICD-10-CM | POA: Diagnosis not present

## 2022-06-02 DIAGNOSIS — E119 Type 2 diabetes mellitus without complications: Secondary | ICD-10-CM | POA: Diagnosis not present

## 2022-06-06 ENCOUNTER — Telehealth (HOSPITAL_COMMUNITY): Payer: Self-pay | Admitting: *Deleted

## 2022-06-06 NOTE — Telephone Encounter (Signed)
Patient given detailed instructions per Myocardial Perfusion Study Information Sheet for the test on  06/08/22 Patient notified to arrive 15 minutes early and that it is imperative to arrive on time for appointment to keep from having the test rescheduled.  If you need to cancel or reschedule your appointment, please call the office within 24 hours of your appointment. . Patient verbalized understanding. Kirstie Peri, RN

## 2022-06-07 DIAGNOSIS — H53433 Sector or arcuate defects, bilateral: Secondary | ICD-10-CM | POA: Diagnosis not present

## 2022-06-07 DIAGNOSIS — H4010X2 Unspecified open-angle glaucoma, moderate stage: Secondary | ICD-10-CM | POA: Diagnosis not present

## 2022-06-07 DIAGNOSIS — E119 Type 2 diabetes mellitus without complications: Secondary | ICD-10-CM | POA: Diagnosis not present

## 2022-06-07 DIAGNOSIS — H2513 Age-related nuclear cataract, bilateral: Secondary | ICD-10-CM | POA: Diagnosis not present

## 2022-06-08 ENCOUNTER — Ambulatory Visit (HOSPITAL_COMMUNITY): Payer: Medicare (Managed Care) | Attending: Cardiovascular Disease

## 2022-06-08 ENCOUNTER — Ambulatory Visit (HOSPITAL_BASED_OUTPATIENT_CLINIC_OR_DEPARTMENT_OTHER): Payer: Medicare (Managed Care)

## 2022-06-08 DIAGNOSIS — I447 Left bundle-branch block, unspecified: Secondary | ICD-10-CM

## 2022-06-08 DIAGNOSIS — I10 Essential (primary) hypertension: Secondary | ICD-10-CM

## 2022-06-08 LAB — MYOCARDIAL PERFUSION IMAGING
LV dias vol: 80 mL (ref 46–106)
LV sys vol: 24 mL
Nuc Stress EF: 71 %
Peak HR: 93 {beats}/min
Rest HR: 77 {beats}/min
Rest Nuclear Isotope Dose: 10.9 mCi
SDS: 1
SRS: 0
SSS: 1
ST Depression (mm): 0 mm
Stress Nuclear Isotope Dose: 30.1 mCi
TID: 0.79

## 2022-06-08 LAB — ECHOCARDIOGRAM COMPLETE
Area-P 1/2: 3.42 cm2
S' Lateral: 2.7 cm

## 2022-06-08 MED ORDER — TECHNETIUM TC 99M TETROFOSMIN IV KIT
10.9000 | PACK | Freq: Once | INTRAVENOUS | Status: AC | PRN
  Administered 2022-06-08: 10.9 via INTRAVENOUS

## 2022-06-08 MED ORDER — PERFLUTREN LIPID MICROSPHERE
1.0000 mL | INTRAVENOUS | Status: AC | PRN
  Administered 2022-06-08: 2 mL via INTRAVENOUS

## 2022-06-08 MED ORDER — REGADENOSON 0.4 MG/5ML IV SOLN
0.4000 mg | Freq: Once | INTRAVENOUS | Status: AC
  Administered 2022-06-08: 0.4 mg via INTRAVENOUS

## 2022-06-08 MED ORDER — TECHNETIUM TC 99M TETROFOSMIN IV KIT
30.1000 | PACK | Freq: Once | INTRAVENOUS | Status: AC | PRN
  Administered 2022-06-08: 30.1 via INTRAVENOUS

## 2022-06-10 ENCOUNTER — Telehealth: Payer: Self-pay

## 2022-06-10 NOTE — Telephone Encounter (Signed)
Left brief message asking patient to call the office.

## 2022-06-10 NOTE — Telephone Encounter (Signed)
-----   Message from Sueanne Margarita, MD sent at 06/08/2022  1:36 PM EST ----- Echo showed normal heart function with EF 60 to 65% with mildly thickened heart muscle increased deafness of the heart normal for age.  There was trivial leakiness of the mitral valve but otherwise normal valve function

## 2022-06-24 ENCOUNTER — Telehealth: Payer: Self-pay

## 2022-06-24 NOTE — Telephone Encounter (Signed)
-----   Message from Nuala Alpha, LPN sent at [id number]  1:00 PM EST -----  ----- Message ----- From: Sueanne Margarita, MD Sent: 06/08/2022   4:04 PM EST To: [facility] Triage  Stress test is low risk with possible breast artifact.  Please find out if she has had any more chest pain.

## 2022-06-24 NOTE — Telephone Encounter (Signed)
No DPR on file, left message with no patient identifiers asking patient to call office.

## 2022-06-29 ENCOUNTER — Telehealth: Payer: Self-pay

## 2022-06-29 NOTE — Telephone Encounter (Signed)
Multiple attempts to call patient to give results of stress test and echocardiogram, letter sent.

## 2022-06-29 NOTE — Telephone Encounter (Signed)
-----   Message from Nuala Alpha, LPN sent at [id number]  1:00 PM EST -----  ----- Message ----- From: Sueanne Margarita, MD Sent: 06/08/2022   4:04 PM EST To: [facility] Triage  Stress test is low risk with possible breast artifact.  Please find out if she has had any more chest pain.

## 2022-08-08 DIAGNOSIS — M65311 Trigger thumb, right thumb: Secondary | ICD-10-CM | POA: Diagnosis not present

## 2022-08-09 ENCOUNTER — Other Ambulatory Visit: Payer: Self-pay | Admitting: Family Medicine

## 2022-08-09 DIAGNOSIS — Z1231 Encounter for screening mammogram for malignant neoplasm of breast: Secondary | ICD-10-CM

## 2022-09-15 ENCOUNTER — Ambulatory Visit: Payer: Medicare (Managed Care)

## 2022-09-29 DIAGNOSIS — E7800 Pure hypercholesterolemia, unspecified: Secondary | ICD-10-CM | POA: Diagnosis not present

## 2022-09-29 DIAGNOSIS — E1121 Type 2 diabetes mellitus with diabetic nephropathy: Secondary | ICD-10-CM | POA: Diagnosis not present

## 2022-09-29 DIAGNOSIS — E559 Vitamin D deficiency, unspecified: Secondary | ICD-10-CM | POA: Diagnosis not present

## 2022-09-29 DIAGNOSIS — H35033 Hypertensive retinopathy, bilateral: Secondary | ICD-10-CM | POA: Diagnosis not present

## 2022-09-29 DIAGNOSIS — H401113 Primary open-angle glaucoma, right eye, severe stage: Secondary | ICD-10-CM | POA: Diagnosis not present

## 2022-09-29 DIAGNOSIS — I700 Atherosclerosis of aorta: Secondary | ICD-10-CM | POA: Diagnosis not present

## 2022-09-29 DIAGNOSIS — E1140 Type 2 diabetes mellitus with diabetic neuropathy, unspecified: Secondary | ICD-10-CM | POA: Diagnosis not present

## 2022-09-29 DIAGNOSIS — Z6831 Body mass index (BMI) 31.0-31.9, adult: Secondary | ICD-10-CM | POA: Diagnosis not present

## 2022-10-17 ENCOUNTER — Ambulatory Visit
Admission: RE | Admit: 2022-10-17 | Discharge: 2022-10-17 | Disposition: A | Discharge status: Home / Self Care | Payer: No Typology Code available for payment source | Source: Ambulatory Visit | Attending: Family Medicine | Admitting: Family Medicine

## 2022-10-17 DIAGNOSIS — Z1231 Encounter for screening mammogram for malignant neoplasm of breast: Secondary | ICD-10-CM | POA: Diagnosis not present

## 2023-02-22 DIAGNOSIS — E559 Vitamin D deficiency, unspecified: Secondary | ICD-10-CM | POA: Diagnosis not present

## 2023-02-22 DIAGNOSIS — E041 Nontoxic single thyroid nodule: Secondary | ICD-10-CM | POA: Diagnosis not present

## 2023-02-22 DIAGNOSIS — E1165 Type 2 diabetes mellitus with hyperglycemia: Secondary | ICD-10-CM | POA: Diagnosis not present

## 2023-02-22 DIAGNOSIS — I10 Essential (primary) hypertension: Secondary | ICD-10-CM | POA: Diagnosis not present

## 2023-02-22 DIAGNOSIS — E1143 Type 2 diabetes mellitus with diabetic autonomic (poly)neuropathy: Secondary | ICD-10-CM | POA: Diagnosis not present

## 2023-02-22 DIAGNOSIS — E538 Deficiency of other specified B group vitamins: Secondary | ICD-10-CM | POA: Diagnosis not present

## 2023-03-04 DIAGNOSIS — I10 Essential (primary) hypertension: Secondary | ICD-10-CM | POA: Diagnosis not present

## 2023-03-04 DIAGNOSIS — Z6829 Body mass index (BMI) 29.0-29.9, adult: Secondary | ICD-10-CM | POA: Diagnosis not present

## 2023-03-04 DIAGNOSIS — E663 Overweight: Secondary | ICD-10-CM | POA: Diagnosis not present

## 2023-03-04 DIAGNOSIS — R32 Unspecified urinary incontinence: Secondary | ICD-10-CM | POA: Diagnosis not present

## 2023-03-04 DIAGNOSIS — Z008 Encounter for other general examination: Secondary | ICD-10-CM | POA: Diagnosis not present

## 2023-04-10 DIAGNOSIS — H52223 Regular astigmatism, bilateral: Secondary | ICD-10-CM | POA: Diagnosis not present

## 2023-04-10 DIAGNOSIS — H524 Presbyopia: Secondary | ICD-10-CM | POA: Diagnosis not present

## 2023-04-11 DIAGNOSIS — E559 Vitamin D deficiency, unspecified: Secondary | ICD-10-CM | POA: Diagnosis not present

## 2023-04-11 DIAGNOSIS — I10 Essential (primary) hypertension: Secondary | ICD-10-CM | POA: Diagnosis not present

## 2023-04-11 DIAGNOSIS — E7800 Pure hypercholesterolemia, unspecified: Secondary | ICD-10-CM | POA: Diagnosis not present

## 2023-04-11 DIAGNOSIS — H35033 Hypertensive retinopathy, bilateral: Secondary | ICD-10-CM | POA: Diagnosis not present

## 2023-04-11 DIAGNOSIS — H401113 Primary open-angle glaucoma, right eye, severe stage: Secondary | ICD-10-CM | POA: Diagnosis not present

## 2023-04-11 DIAGNOSIS — Z6830 Body mass index (BMI) 30.0-30.9, adult: Secondary | ICD-10-CM | POA: Diagnosis not present

## 2023-04-11 DIAGNOSIS — E1140 Type 2 diabetes mellitus with diabetic neuropathy, unspecified: Secondary | ICD-10-CM | POA: Diagnosis not present

## 2023-04-11 DIAGNOSIS — Z23 Encounter for immunization: Secondary | ICD-10-CM | POA: Diagnosis not present

## 2023-04-11 DIAGNOSIS — I700 Atherosclerosis of aorta: Secondary | ICD-10-CM | POA: Diagnosis not present

## 2023-04-11 DIAGNOSIS — E1121 Type 2 diabetes mellitus with diabetic nephropathy: Secondary | ICD-10-CM | POA: Diagnosis not present

## 2023-05-29 DIAGNOSIS — E1165 Type 2 diabetes mellitus with hyperglycemia: Secondary | ICD-10-CM | POA: Diagnosis not present

## 2023-05-29 DIAGNOSIS — E559 Vitamin D deficiency, unspecified: Secondary | ICD-10-CM | POA: Diagnosis not present

## 2023-05-29 DIAGNOSIS — H35033 Hypertensive retinopathy, bilateral: Secondary | ICD-10-CM | POA: Diagnosis not present

## 2023-05-29 DIAGNOSIS — R053 Chronic cough: Secondary | ICD-10-CM | POA: Diagnosis not present

## 2023-05-29 DIAGNOSIS — I10 Essential (primary) hypertension: Secondary | ICD-10-CM | POA: Diagnosis not present

## 2023-06-30 ENCOUNTER — Emergency Department (HOSPITAL_COMMUNITY)
Admission: EM | Admit: 2023-06-30 | Discharge: 2023-06-30 | Disposition: A | Discharge status: Home / Self Care | Payer: No Typology Code available for payment source | Attending: Emergency Medicine | Admitting: Emergency Medicine

## 2023-06-30 ENCOUNTER — Emergency Department (HOSPITAL_COMMUNITY): Payer: No Typology Code available for payment source

## 2023-06-30 DIAGNOSIS — Z79899 Other long term (current) drug therapy: Secondary | ICD-10-CM | POA: Insufficient documentation

## 2023-06-30 DIAGNOSIS — E11649 Type 2 diabetes mellitus with hypoglycemia without coma: Secondary | ICD-10-CM | POA: Diagnosis not present

## 2023-06-30 DIAGNOSIS — I10 Essential (primary) hypertension: Secondary | ICD-10-CM | POA: Diagnosis not present

## 2023-06-30 DIAGNOSIS — M4316 Spondylolisthesis, lumbar region: Secondary | ICD-10-CM | POA: Diagnosis not present

## 2023-06-30 DIAGNOSIS — M545 Low back pain, unspecified: Secondary | ICD-10-CM | POA: Diagnosis not present

## 2023-06-30 DIAGNOSIS — K5730 Diverticulosis of large intestine without perforation or abscess without bleeding: Secondary | ICD-10-CM | POA: Diagnosis not present

## 2023-06-30 DIAGNOSIS — M48061 Spinal stenosis, lumbar region without neurogenic claudication: Secondary | ICD-10-CM | POA: Diagnosis not present

## 2023-06-30 DIAGNOSIS — K654 Sclerosing mesenteritis: Secondary | ICD-10-CM | POA: Diagnosis not present

## 2023-06-30 DIAGNOSIS — Z7984 Long term (current) use of oral hypoglycemic drugs: Secondary | ICD-10-CM | POA: Diagnosis not present

## 2023-06-30 DIAGNOSIS — E162 Hypoglycemia, unspecified: Secondary | ICD-10-CM

## 2023-06-30 DIAGNOSIS — R109 Unspecified abdominal pain: Secondary | ICD-10-CM | POA: Diagnosis not present

## 2023-06-30 LAB — URINALYSIS, ROUTINE W REFLEX MICROSCOPIC
Bilirubin Urine: NEGATIVE
Glucose, UA: NEGATIVE mg/dL
Hgb urine dipstick: NEGATIVE
Ketones, ur: NEGATIVE mg/dL
Leukocytes,Ua: NEGATIVE
Nitrite: NEGATIVE
Protein, ur: NEGATIVE mg/dL
Specific Gravity, Urine: 1.035 — ABNORMAL HIGH (ref 1.005–1.030)
pH: 6 (ref 5.0–8.0)

## 2023-06-30 LAB — CBC WITH DIFFERENTIAL/PLATELET
Abs Immature Granulocytes: 0.02 10*3/uL (ref 0.00–0.07)
Basophils Absolute: 0 10*3/uL (ref 0.0–0.1)
Basophils Relative: 0 %
Eosinophils Absolute: 0.1 10*3/uL (ref 0.0–0.5)
Eosinophils Relative: 1 %
HCT: 39.3 % (ref 36.0–46.0)
Hemoglobin: 12.6 g/dL (ref 12.0–15.0)
Immature Granulocytes: 0 %
Lymphocytes Relative: 23 %
Lymphs Abs: 1.8 10*3/uL (ref 0.7–4.0)
MCH: 29.4 pg (ref 26.0–34.0)
MCHC: 32.1 g/dL (ref 30.0–36.0)
MCV: 91.6 fL (ref 80.0–100.0)
Monocytes Absolute: 0.6 10*3/uL (ref 0.1–1.0)
Monocytes Relative: 7 %
Neutro Abs: 5.5 10*3/uL (ref 1.7–7.7)
Neutrophils Relative %: 69 %
Platelets: 204 10*3/uL (ref 150–400)
RBC: 4.29 MIL/uL (ref 3.87–5.11)
RDW: 12.9 % (ref 11.5–15.5)
WBC: 8 10*3/uL (ref 4.0–10.5)
nRBC: 0 % (ref 0.0–0.2)

## 2023-06-30 LAB — COMPREHENSIVE METABOLIC PANEL
ALT: 14 U/L (ref 0–44)
AST: 20 U/L (ref 15–41)
Albumin: 3.5 g/dL (ref 3.5–5.0)
Alkaline Phosphatase: 55 U/L (ref 38–126)
Anion gap: 8 (ref 5–15)
BUN: 13 mg/dL (ref 8–23)
CO2: 25 mmol/L (ref 22–32)
Calcium: 9.4 mg/dL (ref 8.9–10.3)
Chloride: 104 mmol/L (ref 98–111)
Creatinine, Ser: 0.79 mg/dL (ref 0.44–1.00)
GFR, Estimated: >60 mL/min (ref >60)
Glucose, Bld: 60 mg/dL — ABNORMAL LOW (ref 70–99)
Potassium: 3.8 mmol/L (ref 3.5–5.1)
Sodium: 137 mmol/L (ref 135–145)
Total Bilirubin: 0.8 mg/dL (ref 0.0–1.2)
Total Protein: 6.8 g/dL (ref 6.5–8.1)

## 2023-06-30 LAB — RESP PANEL BY RT-PCR (RSV, FLU A&B, COVID)  RVPGX2
Influenza A by PCR: NEGATIVE
Influenza B by PCR: NEGATIVE
Resp Syncytial Virus by PCR: NEGATIVE
SARS Coronavirus 2 by RT PCR: NEGATIVE

## 2023-06-30 LAB — CBG MONITORING, ED
Glucose-Capillary: 64 mg/dL — ABNORMAL LOW (ref 70–99)
Glucose-Capillary: 47 mg/dL — ABNORMAL LOW (ref 70–99)
Glucose-Capillary: 133 mg/dL — ABNORMAL HIGH (ref 70–99)
Glucose-Capillary: 132 mg/dL — ABNORMAL HIGH (ref 70–99)

## 2023-06-30 LAB — LIPASE, BLOOD: Lipase: 31 U/L (ref 11–51)

## 2023-06-30 MED ORDER — SODIUM CHLORIDE 0.9 % IV BOLUS
1000.0000 mL | Freq: Once | INTRAVENOUS | Status: AC
  Administered 2023-06-30: 1000 mL via INTRAVENOUS

## 2023-06-30 MED ORDER — IBUPROFEN 600 MG PO TABS: 600.0000 mg | ORAL_TABLET | Freq: Three times a day (TID) | ORAL | 0 refills | Status: AC | PRN

## 2023-06-30 MED ORDER — DEXTROSE 50 % IV SOLN
1.0000 | Freq: Once | INTRAVENOUS | Status: AC
  Administered 2023-06-30: 50 mL via INTRAVENOUS
  Filled 2023-06-30: qty 50

## 2023-06-30 MED ORDER — IOHEXOL 300 MG/ML  SOLN
100.0000 mL | Freq: Once | INTRAMUSCULAR | Status: AC | PRN
  Administered 2023-06-30: 100 mL via INTRAVENOUS

## 2023-06-30 MED ORDER — KETOROLAC TROMETHAMINE 30 MG/ML IJ SOLN
30.0000 mg | Freq: Once | INTRAMUSCULAR | Status: AC
  Administered 2023-06-30: 30 mg via INTRAVENOUS
  Filled 2023-06-30: qty 1

## 2023-06-30 NOTE — ED Provider Notes (Signed)
 [place] [facility] AT [facility] Provider Note   CSN: [id number] Arrival date & time: 06/30/23  4098     History  Chief Complaint  Patient presents with   Abdominal Pain   Back Pain    Jenny Wolf is a 78 y.o. female presenting to the ED with lower back pain ongoing for about 3 days.  Patient reports she has pain that radiates across her lower back.  The pain has been persistent.  It is worse with movement and sitting upright.  She denies nausea, vomiting, dysuria, diarrhea or constipation.  She reports a similar pain about 2 years ago which appeared to resolve with pain medicine.  Patient reports that she did not eat anything prior to coming to the ED today.  She says her typical blood sugars around 100 per her arm monitor.  She keeps snacks with her if BS drops below 80.  She has a history of diabetes, typically high blood pressure, high cholesterol.  HPI     Home Medications Prior to Admission medications   Medication Sig Start Date End Date Taking? Authorizing Provider  ibuprofen (ADVIL) 600 MG tablet Take 1 tablet (600 mg total) by mouth every 8 (eight) hours as needed for up to 7 days for mild pain (pain score 1-3) or moderate pain (pain score 4-6). 06/30/23 07/07/23 Yes Novah Goza, Kermit Balo, MD  ezetimibe (ZETIA) 10 MG tablet Take 10 mg by mouth daily.    [provider]  glipiZIDE (GLUCOTROL XL) 5 MG 24 hr tablet Take 5 mg by mouth daily with breakfast.    [provider]  HYDROcodone-acetaminophen (NORCO/VICODIN) 5-325 MG tablet Take 1 tablet by mouth every 6 (six) hours as needed for severe pain. 05/15/22   Gwyneth Sprout, MD  ibuprofen (ADVIL,MOTRIN) 800 MG tablet Take 1 tablet (800 mg total) by mouth 3 (three) times daily. 03/08/15   Joy, Shawn C, PA-C  latanoprost (XALATAN) 0.005 % ophthalmic solution Place 1 drop into both eyes at bedtime.    [provider]  lisinopril (PRINIVIL,ZESTRIL) 20 MG tablet Take 20 mg by  mouth daily.    [provider]  metFORMIN (GLUCOPHAGE) 1000 MG tablet Take 1,000 mg by mouth daily with breakfast.     [provider]  methocarbamol (ROBAXIN) 500 MG tablet Take 1 tablet (500 mg total) by mouth 2 (two) times daily. 05/15/22   Gwyneth Sprout, MD  pravastatin (PRAVACHOL) 20 MG tablet Take 20 mg by mouth daily.    [provider]  timolol (BETIMOL) 0.5 % ophthalmic solution Place 1 drop into both eyes 2 (two) times daily.    [provider]  tiZANidine (ZANAFLEX) 2 MG tablet Take 1 tablet (2 mg total) by mouth every 8 (eight) hours as needed for muscle spasms. 12/04/18   Derwood Kaplan, MD  traMADol (ULTRAM) 50 MG tablet Take 1 tablet (50 mg total) by mouth every 6 (six) hours as needed. 03/07/15   Danelle Berry, PA-C      Allergies    Apple juice    Review of Systems   Review of Systems  Physical Exam Updated Vital Signs BP 102/62 (BP Location: Right Arm)   Pulse 90   Temp 97.7 F (36.5 C) (Oral)   Resp 16   SpO2 94%  Physical Exam Constitutional:      General: She is not in acute distress. HENT:     Head: Normocephalic and atraumatic.  Eyes:     Conjunctiva/sclera: Conjunctivae normal.  Pupils: Pupils are equal, round, and reactive to light.  Cardiovascular:     Rate and Rhythm: Regular rhythm. Tachycardia present.  Pulmonary:     Effort: Pulmonary effort is normal. No respiratory distress.  Abdominal:     General: There is no distension.     Tenderness: There is no abdominal tenderness. There is left CVA tenderness.  Skin:    General: Skin is warm and dry.  Neurological:     General: No focal deficit present.     Mental Status: She is alert. Mental status is at baseline.  Psychiatric:        Mood and Affect: Mood normal.        Behavior: Behavior normal.     ED Results / Procedures / Treatments   Labs (all labs ordered are listed, but only abnormal results are displayed) Labs Reviewed  URINALYSIS, ROUTINE W  REFLEX MICROSCOPIC - Abnormal; Notable for the following components:      Result Value   APPearance HAZY (*)    Specific Gravity, Urine 1.035 (*)    All other components within normal limits  COMPREHENSIVE METABOLIC PANEL - Abnormal; Notable for the following components:   Glucose, Bld 60 (*)    All other components within normal limits  CBG MONITORING, ED - Abnormal; Notable for the following components:   Glucose-Capillary 64 (*)    All other components within normal limits  CBG MONITORING, ED - Abnormal; Notable for the following components:   Glucose-Capillary 47 (*)    All other components within normal limits  CBG MONITORING, ED - Abnormal; Notable for the following components:   Glucose-Capillary 133 (*)    All other components within normal limits  CBG MONITORING, ED - Abnormal; Notable for the following components:   Glucose-Capillary 132 (*)    All other components within normal limits  RESP PANEL BY RT-PCR (RSV, FLU A&B, COVID)  RVPGX2  CBC WITH DIFFERENTIAL/PLATELET  LIPASE, BLOOD    EKG None  Radiology CT ABDOMEN PELVIS W CONTRAST Result Date: 06/30/2023 CLINICAL DATA:  Three day history of left-sided abdominal pain EXAM: CT ABDOMEN AND PELVIS WITH CONTRAST TECHNIQUE: Multidetector CT imaging of the abdomen and pelvis was performed using the standard protocol following bolus administration of intravenous contrast. RADIATION DOSE REDUCTION: This exam was performed according to the departmental dose-optimization program which includes automated exposure control, adjustment of the mA and/or kV according to patient size and/or use of iterative reconstruction technique. CONTRAST:  OMNIPAQUE IOHEXOL 300 MG/ML  SOLN COMPARISON:  CTA abdomen and pelvis dated 05/15/2022 FINDINGS: Lower chest: Bilateral lower lobe relaxation atelectasis. Trace right pleural effusion. Partially imaged heart size is normal. Prominent right pericardial sleeve recess. Hepatobiliary: Subcentimeter  hypodensity in segment 6 (2:25), too small to characterize but unchanged. No intra or extrahepatic biliary ductal dilation. Normal gallbladder. Pancreas: No focal lesions or main ductal dilation. Spleen: Normal in size without focal abnormality. Adrenals/Urinary Tract: No adrenal nodules. No suspicious renal mass, calculi or hydronephrosis. No focal bladder wall thickening. Stomach/Bowel: Normal appearance of the stomach. No abnormal bowel dilation or mural thickening. 9 x 7 mm enhancing nodular focus within a loop of anteromedial left hemi abdominal small bowel (2:56), not seen on prior examination. Colonic diverticulosis without acute diverticulitis. Normal appendix. Vascular/Lymphatic: Aortic atherosclerosis. No enlarged abdominal or pelvic lymph nodes. Reproductive: No adnexal masses. Other: No free fluid, fluid collection, or free air. Nonspecific mildly increased mesenteric stranding associated with increased number and prominence of subcentimeter mesenteric lymph nodes. Musculoskeletal:  No acute or abnormal lytic or blastic osseous lesions. Multilevel degenerative changes of the partially imaged thoracic and lumbar spine. Please see separately dictated CT lumbar spine report for detailed findings. IMPRESSION: 1. No acute abdominopelvic findings. 2. Colonic diverticulosis without acute diverticulitis. 3. Nonspecific mildly increased mesenteric stranding associated with increased number and prominence of subcentimeter mesenteric lymph nodes, which can be seen in the setting of mesenteric panniculitis. 4. A 9 x 7 mm enhancing nodular focus within a loop of anteromedial left hemi abdominal small bowel, not seen on prior examination. This may represent a small bowel polyp or ingested intraluminal material. Recommend follow-up with gastroenterology. 5. Trace right pleural effusion. 6.  Aortic Atherosclerosis (ICD10-I70.0). Electronically Signed   By: Agustin Cree M.D.   On: 06/30/2023 12:41   CT L-SPINE NO  CHARGE Result Date: 06/30/2023 CLINICAL DATA:  Left-sided abdominal pain.  Three day history EXAM: CT LUMBAR SPINE WITHOUT CONTRAST TECHNIQUE: Multidetector CT imaging of the lumbar spine was performed without intravenous contrast administration. Multiplanar CT image reconstructions were also generated. RADIATION DOSE REDUCTION: This exam was performed according to the departmental dose-optimization program which includes automated exposure control, adjustment of the mA and/or kV according to patient size and/or use of iterative reconstruction technique. COMPARISON:  Lumbar spine CT 01/28/2018 FINDINGS: Segmentation: 5 lumbar type vertebrae. Alignment: Trace anterolisthesis of L3 on L4 Vertebrae: No acute fracture or focal pathologic process. Paraspinal and other soft tissues: See separately dictated CT abdomen and pelvis for additional findings. Disc levels: Moderate to severe spinal canal stenosis at L3-L4 secondary to a combination of a disc bulge and ligamentum flavum hypertrophy. Moderate to severe bilateral neural foraminal stenosis at L4-L5 and L5-S1 IMPRESSION: 1. No acute fracture or traumatic listhesis. 2. Moderate to severe spinal canal stenosis at L3-L4 secondary to a combination of a disc bulge and ligamentum flavum hypertrophy. 3. Moderate to severe bilateral neural foraminal stenosis at L4-L5 and L5-S1. Electronically Signed   By: Lorenza Cambridge M.D.   On: 06/30/2023 11:37    Procedures Procedures    Medications Ordered in ED Medications  sodium chloride 0.9 % bolus 1,000 mL (0 mLs Intravenous Stopped 06/30/23 0948)  iohexol (OMNIPAQUE) 300 MG/ML solution 100 mL (100 mLs Intravenous Contrast Given 06/30/23 1055)  dextrose 50 % solution 50 mL (50 mLs Intravenous Given 06/30/23 1248)  ketorolac (TORADOL) 30 MG/ML injection 30 mg (30 mg Intravenous Given 06/30/23 1306)    ED Course/ Medical Decision Making/ A&P Clinical Course as of 06/30/23 1449  Fri Jun 30, 2023  1156 RN advised to give  food to patient for hypoglycemia and perform in and out cath if patient is still unable to provide urine after nearly 6 hours in ED and IV fluids given [MT]  1208 Pt provided urine [MT]  1257 Glucose 78 on pt's arm monitor now after eating sandwich and d50 amp, but will need recheck in 1 hour [MT]  1446 Hypoglycemia resolved, patient feeling well, reports that the pain is gone with Toradol.  She is stable for discharge.  See [facility] as outpatient [MT]    Clinical Course User Index [MT] Keylin Ferryman, Kermit Balo, MD                                 Medical Decision Making Amount and/or Complexity of Data Reviewed Labs: ordered. Radiology: ordered.  Risk Prescription drug management.   This patient presents to the ED with concern for  left flank pain, lower back pain. This involves an extensive number of treatment options, and is a complaint that carries with it a high risk of complications and morbidity.  The differential diagnosis includes UTI versus colitis versus intra-abdominal process versus lower thoracic disc herniation or radiculopathy versus other  I do not see evidence of shingles rash at this time to suggest that this is developing shingles neuropathy.  External records from outside source obtained and reviewed including coronary CT imaging performed 1 year ago in January 2024 with a calcium score of 0, no significant coronary disease noted.  Prior to that in early January the patient had 2 CT angiogram dissection studies performed as there was concern for potential artifact versus hematoma or dissection initial scan, per repeat scan with less limited artifact noted no evidence of acute aortic syndrome and no aneurysm of thoracic aorta.  I ordered and personally interpreted labs.  The pertinent results include: No emergent findings.  Patient was initially hypoglycemic but this resolved on recheck  I ordered imaging studies including CT abdomen and pelvis I independently visualized and  interpreted imaging which showed potential mesenteric panniculitis, small polyp or nodule noted along the colon new from prior imaging, chronic diverticulosis without acute inflammation I agree with the radiologist interpretation  The patient was maintained on a cardiac monitor.  I personally viewed and interpreted the cardiac monitored which showed an underlying rhythm of: Sinus rhythm  I ordered medication including IV fluids and toradol and d50 for hypoglycemia, soft BP, back pain  I have reviewed the patients home medicines and have made adjustments as needed  Test Considered: doubt AAA, mesenteric ischemia  After the interventions noted above, I reevaluated the patient and found that they have: improved  Dispostion:  After consideration of the diagnostic results and the patients response to treatment, I feel that the patent would benefit from close outpatient follow up.         Final Clinical Impression(s) / ED Diagnoses Final diagnoses:  Acute midline low back pain, unspecified whether sciatica present  Mesenteric panniculitis (HCC)  Hypoglycemia    Rx / DC Orders ED Discharge Orders          Ordered    ibuprofen (ADVIL) 600 MG tablet  Every 8 hours PRN        06/30/23 1447              Terald Sleeper, MD 06/30/23 [phone]

## 2023-06-30 NOTE — ED Notes (Signed)
 Pt ambulated to bathroom

## 2023-06-30 NOTE — ED Triage Notes (Addendum)
 Patient arrived with complaints of left sided back pain and abdominal pain over the last three days. Worsens with movement. Declines any other symptoms.

## 2023-06-30 NOTE — ED Notes (Signed)
 Patient transported to CT

## 2023-06-30 NOTE — ED Notes (Signed)
 Patient  was given cranberry juice and crackers.

## 2023-06-30 NOTE — ED Notes (Signed)
Patient given turkey sandwich and sprite °

## 2023-06-30 NOTE — Discharge Instructions (Addendum)
 You are diagnosed with inflammation of the lining of your abdomen that is called mesenteric panniculitis.  This may be causing pain that you are feeling inside your abdomen or mid back.  This is typically treated with NSAID medications such as ibuprofen, which you can take 600 mg every 8 hours (with food) for the next 7 days.  You can follow-up with your doctor for this issue.  Please be aware that your CT scan also showed a possible polyp inside your bowels.  This is something that would need follow-up with a gastroenterologist.  I placed a referral to a local gastroenterology group for this issue.  Your blood sugar was also lower than normal throughout your stay in the ED today.  Your sugar improved with food.  It is possible your sugar levels were low because he did not eat breakfast or food this morning.  Please keep a close eye and your blood sugar monitoring at home, making sure to keep sugar or small snacks with you at all times.

## 2023-07-24 DIAGNOSIS — I10 Essential (primary) hypertension: Secondary | ICD-10-CM | POA: Diagnosis not present

## 2023-07-24 DIAGNOSIS — Z0000 Encounter for general adult medical examination without abnormal findings: Secondary | ICD-10-CM | POA: Diagnosis not present

## 2023-07-24 DIAGNOSIS — E1165 Type 2 diabetes mellitus with hyperglycemia: Secondary | ICD-10-CM | POA: Diagnosis not present

## 2023-07-24 DIAGNOSIS — E559 Vitamin D deficiency, unspecified: Secondary | ICD-10-CM | POA: Diagnosis not present

## 2023-07-24 DIAGNOSIS — I700 Atherosclerosis of aorta: Secondary | ICD-10-CM | POA: Diagnosis not present

## 2023-08-09 DIAGNOSIS — H401122 Primary open-angle glaucoma, left eye, moderate stage: Secondary | ICD-10-CM | POA: Diagnosis not present

## 2023-08-09 DIAGNOSIS — H53433 Sector or arcuate defects, bilateral: Secondary | ICD-10-CM | POA: Diagnosis not present

## 2023-08-09 DIAGNOSIS — H35033 Hypertensive retinopathy, bilateral: Secondary | ICD-10-CM | POA: Diagnosis not present

## 2023-08-09 DIAGNOSIS — H401113 Primary open-angle glaucoma, right eye, severe stage: Secondary | ICD-10-CM | POA: Diagnosis not present

## 2023-08-09 DIAGNOSIS — H47233 Glaucomatous optic atrophy, bilateral: Secondary | ICD-10-CM | POA: Diagnosis not present

## 2023-08-29 DIAGNOSIS — E7800 Pure hypercholesterolemia, unspecified: Secondary | ICD-10-CM | POA: Diagnosis not present

## 2023-08-29 DIAGNOSIS — E559 Vitamin D deficiency, unspecified: Secondary | ICD-10-CM | POA: Diagnosis not present

## 2023-08-29 DIAGNOSIS — E119 Type 2 diabetes mellitus without complications: Secondary | ICD-10-CM | POA: Diagnosis not present

## 2023-08-29 DIAGNOSIS — I10 Essential (primary) hypertension: Secondary | ICD-10-CM | POA: Diagnosis not present

## 2023-09-21 ENCOUNTER — Other Ambulatory Visit: Payer: Self-pay | Admitting: Family Medicine

## 2023-09-21 DIAGNOSIS — Z1231 Encounter for screening mammogram for malignant neoplasm of breast: Secondary | ICD-10-CM

## 2023-10-11 DIAGNOSIS — E559 Vitamin D deficiency, unspecified: Secondary | ICD-10-CM | POA: Diagnosis not present

## 2023-10-11 DIAGNOSIS — I10 Essential (primary) hypertension: Secondary | ICD-10-CM | POA: Diagnosis not present

## 2023-10-11 DIAGNOSIS — I700 Atherosclerosis of aorta: Secondary | ICD-10-CM | POA: Diagnosis not present

## 2023-10-11 DIAGNOSIS — E1165 Type 2 diabetes mellitus with hyperglycemia: Secondary | ICD-10-CM | POA: Diagnosis not present

## 2023-10-11 DIAGNOSIS — H35033 Hypertensive retinopathy, bilateral: Secondary | ICD-10-CM | POA: Diagnosis not present

## 2023-10-16 DIAGNOSIS — I10 Essential (primary) hypertension: Secondary | ICD-10-CM | POA: Diagnosis not present

## 2023-10-16 DIAGNOSIS — E1143 Type 2 diabetes mellitus with diabetic autonomic (poly)neuropathy: Secondary | ICD-10-CM | POA: Diagnosis not present

## 2023-10-16 DIAGNOSIS — E0821 Diabetes mellitus due to underlying condition with diabetic nephropathy: Secondary | ICD-10-CM | POA: Diagnosis not present

## 2023-10-16 DIAGNOSIS — E1165 Type 2 diabetes mellitus with hyperglycemia: Secondary | ICD-10-CM | POA: Diagnosis not present

## 2023-10-18 ENCOUNTER — Ambulatory Visit
Admission: RE | Admit: 2023-10-18 | Discharge: 2023-10-18 | Disposition: A | Discharge status: Home / Self Care | Source: Ambulatory Visit | Attending: Family Medicine | Admitting: Family Medicine

## 2023-10-18 DIAGNOSIS — Z1231 Encounter for screening mammogram for malignant neoplasm of breast: Secondary | ICD-10-CM | POA: Diagnosis not present

## 2023-10-30 DIAGNOSIS — H35033 Hypertensive retinopathy, bilateral: Secondary | ICD-10-CM | POA: Diagnosis not present

## 2023-10-30 DIAGNOSIS — H401113 Primary open-angle glaucoma, right eye, severe stage: Secondary | ICD-10-CM | POA: Diagnosis not present

## 2023-10-30 DIAGNOSIS — E1165 Type 2 diabetes mellitus with hyperglycemia: Secondary | ICD-10-CM | POA: Diagnosis not present

## 2023-10-30 DIAGNOSIS — I10 Essential (primary) hypertension: Secondary | ICD-10-CM | POA: Diagnosis not present

## 2023-10-30 DIAGNOSIS — E1143 Type 2 diabetes mellitus with diabetic autonomic (poly)neuropathy: Secondary | ICD-10-CM | POA: Diagnosis not present

## 2023-10-30 DIAGNOSIS — E0821 Diabetes mellitus due to underlying condition with diabetic nephropathy: Secondary | ICD-10-CM | POA: Diagnosis not present

## 2023-11-14 DIAGNOSIS — E1165 Type 2 diabetes mellitus with hyperglycemia: Secondary | ICD-10-CM | POA: Diagnosis not present

## 2023-11-14 DIAGNOSIS — E1143 Type 2 diabetes mellitus with diabetic autonomic (poly)neuropathy: Secondary | ICD-10-CM | POA: Diagnosis not present

## 2023-11-14 DIAGNOSIS — E0821 Diabetes mellitus due to underlying condition with diabetic nephropathy: Secondary | ICD-10-CM | POA: Diagnosis not present

## 2023-11-14 DIAGNOSIS — I10 Essential (primary) hypertension: Secondary | ICD-10-CM | POA: Diagnosis not present

## 2023-11-30 DIAGNOSIS — E0821 Diabetes mellitus due to underlying condition with diabetic nephropathy: Secondary | ICD-10-CM | POA: Diagnosis not present

## 2023-11-30 DIAGNOSIS — H401113 Primary open-angle glaucoma, right eye, severe stage: Secondary | ICD-10-CM | POA: Diagnosis not present

## 2023-11-30 DIAGNOSIS — H35033 Hypertensive retinopathy, bilateral: Secondary | ICD-10-CM | POA: Diagnosis not present

## 2023-11-30 DIAGNOSIS — E1143 Type 2 diabetes mellitus with diabetic autonomic (poly)neuropathy: Secondary | ICD-10-CM | POA: Diagnosis not present

## 2023-11-30 DIAGNOSIS — I10 Essential (primary) hypertension: Secondary | ICD-10-CM | POA: Diagnosis not present

## 2023-11-30 DIAGNOSIS — E1165 Type 2 diabetes mellitus with hyperglycemia: Secondary | ICD-10-CM | POA: Diagnosis not present

## 2023-12-26 DIAGNOSIS — E7800 Pure hypercholesterolemia, unspecified: Secondary | ICD-10-CM | POA: Diagnosis not present

## 2023-12-26 DIAGNOSIS — I10 Essential (primary) hypertension: Secondary | ICD-10-CM | POA: Diagnosis not present

## 2023-12-26 DIAGNOSIS — E041 Nontoxic single thyroid nodule: Secondary | ICD-10-CM | POA: Diagnosis not present

## 2023-12-26 DIAGNOSIS — E559 Vitamin D deficiency, unspecified: Secondary | ICD-10-CM | POA: Diagnosis not present

## 2023-12-26 DIAGNOSIS — E119 Type 2 diabetes mellitus without complications: Secondary | ICD-10-CM | POA: Diagnosis not present

## 2023-12-31 DIAGNOSIS — E1143 Type 2 diabetes mellitus with diabetic autonomic (poly)neuropathy: Secondary | ICD-10-CM | POA: Diagnosis not present

## 2023-12-31 DIAGNOSIS — H401113 Primary open-angle glaucoma, right eye, severe stage: Secondary | ICD-10-CM | POA: Diagnosis not present

## 2023-12-31 DIAGNOSIS — I10 Essential (primary) hypertension: Secondary | ICD-10-CM | POA: Diagnosis not present

## 2023-12-31 DIAGNOSIS — H35033 Hypertensive retinopathy, bilateral: Secondary | ICD-10-CM | POA: Diagnosis not present

## 2024-01-23 DIAGNOSIS — E1143 Type 2 diabetes mellitus with diabetic autonomic (poly)neuropathy: Secondary | ICD-10-CM | POA: Diagnosis not present

## 2024-01-23 DIAGNOSIS — I10 Essential (primary) hypertension: Secondary | ICD-10-CM | POA: Diagnosis not present

## 2024-01-23 DIAGNOSIS — E1165 Type 2 diabetes mellitus with hyperglycemia: Secondary | ICD-10-CM | POA: Diagnosis not present

## 2024-01-30 DIAGNOSIS — E1165 Type 2 diabetes mellitus with hyperglycemia: Secondary | ICD-10-CM | POA: Diagnosis not present

## 2024-01-30 DIAGNOSIS — E1143 Type 2 diabetes mellitus with diabetic autonomic (poly)neuropathy: Secondary | ICD-10-CM | POA: Diagnosis not present

## 2024-01-30 DIAGNOSIS — I10 Essential (primary) hypertension: Secondary | ICD-10-CM | POA: Diagnosis not present

## 2024-01-30 DIAGNOSIS — H401113 Primary open-angle glaucoma, right eye, severe stage: Secondary | ICD-10-CM | POA: Diagnosis not present

## 2024-01-30 DIAGNOSIS — H35033 Hypertensive retinopathy, bilateral: Secondary | ICD-10-CM | POA: Diagnosis not present
